# Patient Record
Sex: Female | Born: 1955 | Race: White | Hispanic: No | Marital: Married | Smoking: Never smoker
Health system: Southern US, Community
[De-identification: ages and names within clinical notes are randomized; demographics above are authoritative.]

## PROBLEM LIST (undated history)

## (undated) DIAGNOSIS — C801 Malignant (primary) neoplasm, unspecified: Secondary | ICD-10-CM

## (undated) DIAGNOSIS — E7800 Pure hypercholesterolemia, unspecified: Secondary | ICD-10-CM

## (undated) DIAGNOSIS — I2510 Atherosclerotic heart disease of native coronary artery without angina pectoris: Secondary | ICD-10-CM

## (undated) DIAGNOSIS — T4145XA Adverse effect of unspecified anesthetic, initial encounter: Secondary | ICD-10-CM

## (undated) DIAGNOSIS — Z87442 Personal history of urinary calculi: Secondary | ICD-10-CM

## (undated) DIAGNOSIS — T8859XA Other complications of anesthesia, initial encounter: Secondary | ICD-10-CM

## (undated) DIAGNOSIS — I10 Essential (primary) hypertension: Secondary | ICD-10-CM

## (undated) HISTORY — PX: ANKLE SURGERY: SHX546

## (undated) HISTORY — DX: Personal history of urinary calculi: Z87.442

## (undated) HISTORY — DX: Atherosclerotic heart disease of native coronary artery without angina pectoris: I25.10

---

## 2006-06-20 ENCOUNTER — Ambulatory Visit: Payer: Self-pay | Admitting: Obstetrics and Gynecology

## 2006-07-22 ENCOUNTER — Emergency Department: Payer: Self-pay | Admitting: Emergency Medicine

## 2006-07-22 ENCOUNTER — Other Ambulatory Visit: Payer: Self-pay

## 2006-11-27 ENCOUNTER — Ambulatory Visit: Payer: Self-pay | Admitting: Podiatry

## 2007-07-17 ENCOUNTER — Ambulatory Visit: Payer: Self-pay | Admitting: Obstetrics and Gynecology

## 2007-08-19 ENCOUNTER — Ambulatory Visit: Payer: Self-pay | Admitting: Gastroenterology

## 2008-07-20 ENCOUNTER — Ambulatory Visit: Payer: Self-pay | Admitting: Obstetrics and Gynecology

## 2008-07-23 ENCOUNTER — Ambulatory Visit: Payer: Self-pay | Admitting: Obstetrics and Gynecology

## 2009-06-09 ENCOUNTER — Ambulatory Visit: Payer: Self-pay | Admitting: Obstetrics and Gynecology

## 2009-06-21 ENCOUNTER — Ambulatory Visit: Payer: Self-pay | Admitting: Obstetrics and Gynecology

## 2010-07-11 ENCOUNTER — Other Ambulatory Visit: Payer: Self-pay | Admitting: Family

## 2010-08-01 ENCOUNTER — Other Ambulatory Visit: Payer: Self-pay | Admitting: Family

## 2010-08-02 ENCOUNTER — Ambulatory Visit: Payer: Self-pay | Admitting: Obstetrics and Gynecology

## 2011-08-15 ENCOUNTER — Ambulatory Visit: Payer: Self-pay

## 2011-08-29 ENCOUNTER — Ambulatory Visit: Payer: Self-pay

## 2012-08-20 ENCOUNTER — Ambulatory Visit: Payer: Self-pay

## 2013-08-17 ENCOUNTER — Ambulatory Visit: Payer: Self-pay | Admitting: Orthopedic Surgery

## 2013-11-16 ENCOUNTER — Ambulatory Visit: Payer: Self-pay | Admitting: Podiatry

## 2014-01-13 ENCOUNTER — Ambulatory Visit: Payer: Self-pay

## 2014-01-22 ENCOUNTER — Ambulatory Visit: Payer: Self-pay

## 2014-03-11 DIAGNOSIS — N200 Calculus of kidney: Secondary | ICD-10-CM | POA: Insufficient documentation

## 2014-03-11 DIAGNOSIS — I10 Essential (primary) hypertension: Secondary | ICD-10-CM | POA: Insufficient documentation

## 2014-06-28 ENCOUNTER — Ambulatory Visit
Admission: RE | Admit: 2014-06-28 | Discharge: 2014-06-28 | Disposition: A | Discharge status: Home / Self Care | Payer: BC Managed Care – PPO | Source: Ambulatory Visit | Attending: Internal Medicine | Admitting: Internal Medicine

## 2014-06-28 ENCOUNTER — Other Ambulatory Visit: Payer: Self-pay | Admitting: Internal Medicine

## 2014-06-28 DIAGNOSIS — R079 Chest pain, unspecified: Secondary | ICD-10-CM

## 2014-07-20 ENCOUNTER — Other Ambulatory Visit: Payer: Self-pay | Admitting: Internal Medicine

## 2014-07-20 DIAGNOSIS — R1084 Generalized abdominal pain: Secondary | ICD-10-CM

## 2014-07-22 ENCOUNTER — Other Ambulatory Visit: Payer: BC Managed Care – PPO

## 2014-07-23 ENCOUNTER — Encounter (INDEPENDENT_AMBULATORY_CARE_PROVIDER_SITE_OTHER): Payer: Self-pay

## 2014-07-23 ENCOUNTER — Ambulatory Visit
Admission: RE | Admit: 2014-07-23 | Discharge: 2014-07-23 | Disposition: A | Discharge status: Home / Self Care | Payer: BC Managed Care – PPO | Source: Ambulatory Visit | Attending: Internal Medicine | Admitting: Internal Medicine

## 2014-07-23 DIAGNOSIS — R1084 Generalized abdominal pain: Secondary | ICD-10-CM

## 2015-03-17 ENCOUNTER — Ambulatory Visit
Admit: 2015-03-17 | Disposition: A | Discharge status: Home / Self Care | Payer: Self-pay | Attending: Obstetrics and Gynecology | Admitting: Obstetrics and Gynecology

## 2015-10-07 ENCOUNTER — Other Ambulatory Visit: Payer: Self-pay | Admitting: Unknown Physician Specialty

## 2015-10-07 DIAGNOSIS — L989 Disorder of the skin and subcutaneous tissue, unspecified: Secondary | ICD-10-CM

## 2015-10-07 DIAGNOSIS — G8929 Other chronic pain: Secondary | ICD-10-CM

## 2015-10-07 DIAGNOSIS — M25562 Pain in left knee: Principal | ICD-10-CM

## 2015-10-18 ENCOUNTER — Ambulatory Visit
Admission: RE | Admit: 2015-10-18 | Discharge: 2015-10-18 | Disposition: A | Discharge status: Home / Self Care | Payer: BLUE CROSS/BLUE SHIELD | Source: Ambulatory Visit | Attending: Unknown Physician Specialty | Admitting: Unknown Physician Specialty

## 2015-10-18 DIAGNOSIS — G8929 Other chronic pain: Secondary | ICD-10-CM | POA: Diagnosis present

## 2015-10-18 DIAGNOSIS — M7122 Synovial cyst of popliteal space [Baker], left knee: Secondary | ICD-10-CM | POA: Insufficient documentation

## 2015-10-18 DIAGNOSIS — M2242 Chondromalacia patellae, left knee: Secondary | ICD-10-CM | POA: Insufficient documentation

## 2015-10-18 DIAGNOSIS — M25562 Pain in left knee: Secondary | ICD-10-CM | POA: Diagnosis present

## 2015-10-19 ENCOUNTER — Ambulatory Visit
Admission: RE | Admit: 2015-10-19 | Discharge: 2015-10-19 | Disposition: A | Discharge status: Home / Self Care | Payer: BLUE CROSS/BLUE SHIELD | Source: Ambulatory Visit | Attending: Unknown Physician Specialty | Admitting: Unknown Physician Specialty

## 2015-10-19 DIAGNOSIS — L989 Disorder of the skin and subcutaneous tissue, unspecified: Secondary | ICD-10-CM | POA: Insufficient documentation

## 2015-10-19 MED ORDER — GADOBENATE DIMEGLUMINE 529 MG/ML IV SOLN
20.0000 mL | Freq: Once | INTRAVENOUS | Status: AC | PRN
  Administered 2015-10-19: 20 mL via INTRAVENOUS

## 2015-10-28 ENCOUNTER — Other Ambulatory Visit: Payer: Self-pay

## 2015-10-31 ENCOUNTER — Ambulatory Visit: Payer: Self-pay

## 2015-11-01 ENCOUNTER — Ambulatory Visit: Admission: RE | Admit: 2015-11-01 | Payer: Self-pay | Source: Ambulatory Visit

## 2015-11-01 ENCOUNTER — Ambulatory Visit (HOSPITAL_COMMUNITY): Payer: Self-pay

## 2015-11-01 ENCOUNTER — Ambulatory Visit: Payer: Self-pay

## 2015-12-29 ENCOUNTER — Telehealth: Payer: BLUE CROSS/BLUE SHIELD | Admitting: Physician Assistant

## 2015-12-29 DIAGNOSIS — J019 Acute sinusitis, unspecified: Secondary | ICD-10-CM

## 2015-12-29 DIAGNOSIS — B9689 Other specified bacterial agents as the cause of diseases classified elsewhere: Secondary | ICD-10-CM

## 2015-12-29 MED ORDER — DOXYCYCLINE HYCLATE 100 MG PO CAPS: 100.0000 mg | ORAL_CAPSULE | Freq: Two times a day (BID) | ORAL | Status: DC

## 2015-12-29 NOTE — Progress Notes (Signed)

## 2016-11-06 ENCOUNTER — Encounter: Payer: Self-pay | Admitting: *Deleted

## 2016-11-06 ENCOUNTER — Emergency Department (HOSPITAL_COMMUNITY): Payer: BLUE CROSS/BLUE SHIELD

## 2016-11-06 ENCOUNTER — Ambulatory Visit
Admission: EM | Admit: 2016-11-06 | Discharge: 2016-11-06 | Disposition: A | Discharge status: Home / Self Care | Payer: BLUE CROSS/BLUE SHIELD | Attending: Family Medicine | Admitting: Family Medicine

## 2016-11-06 ENCOUNTER — Encounter (HOSPITAL_COMMUNITY): Payer: Self-pay | Admitting: Emergency Medicine

## 2016-11-06 ENCOUNTER — Emergency Department (HOSPITAL_COMMUNITY)
Admission: EM | Admit: 2016-11-06 | Discharge: 2016-11-06 | Disposition: A | Discharge status: Home / Self Care | Payer: BLUE CROSS/BLUE SHIELD | Attending: Emergency Medicine | Admitting: Emergency Medicine

## 2016-11-06 DIAGNOSIS — R001 Bradycardia, unspecified: Secondary | ICD-10-CM | POA: Diagnosis not present

## 2016-11-06 DIAGNOSIS — R0789 Other chest pain: Secondary | ICD-10-CM | POA: Insufficient documentation

## 2016-11-06 DIAGNOSIS — R9431 Abnormal electrocardiogram [ECG] [EKG]: Secondary | ICD-10-CM | POA: Diagnosis not present

## 2016-11-06 DIAGNOSIS — R079 Chest pain, unspecified: Secondary | ICD-10-CM | POA: Diagnosis present

## 2016-11-06 DIAGNOSIS — I10 Essential (primary) hypertension: Secondary | ICD-10-CM | POA: Diagnosis not present

## 2016-11-06 DIAGNOSIS — Z79899 Other long term (current) drug therapy: Secondary | ICD-10-CM | POA: Diagnosis not present

## 2016-11-06 HISTORY — DX: Pure hypercholesterolemia, unspecified: E78.00

## 2016-11-06 HISTORY — DX: Essential (primary) hypertension: I10

## 2016-11-06 LAB — CBC
HCT: 37.6 % (ref 36.0–46.0)
HEMOGLOBIN: 12.2 g/dL (ref 12.0–15.0)
MCH: 27.9 pg (ref 26.0–34.0)
MCHC: 32.4 g/dL (ref 30.0–36.0)
MCV: 85.8 fL (ref 78.0–100.0)
Platelets: 265 10*3/uL (ref 150–400)
RBC: 4.38 MIL/uL (ref 3.87–5.11)
RDW: 13.9 % (ref 11.5–15.5)
WBC: 4.2 10*3/uL (ref 4.0–10.5)

## 2016-11-06 LAB — I-STAT TROPONIN, ED: TROPONIN I, POC: 0 ng/mL (ref 0.00–0.08)

## 2016-11-06 LAB — BASIC METABOLIC PANEL
ANION GAP: 8 (ref 5–15)
BUN: 13 mg/dL (ref 6–20)
CALCIUM: 9.9 mg/dL (ref 8.9–10.3)
CO2: 25 mmol/L (ref 22–32)
CREATININE: 0.95 mg/dL (ref 0.44–1.00)
Chloride: 105 mmol/L (ref 101–111)
GFR calc Af Amer: >60 mL/min (ref >60)
GLUCOSE: 190 mg/dL — AB (ref 65–99)
Potassium: 3.6 mmol/L (ref 3.5–5.1)
Sodium: 138 mmol/L (ref 135–145)

## 2016-11-06 MED ORDER — ASPIRIN 81 MG PO CHEW
324.0000 mg | CHEWABLE_TABLET | Freq: Once | ORAL | Status: AC
  Administered 2016-11-06: 324 mg via ORAL

## 2016-11-06 NOTE — Discharge Instructions (Signed)
Recommend further evaluation in the ED

## 2016-11-06 NOTE — ED Triage Notes (Signed)
Pt to ED with c/o chest "discomfort" and hypertension. States BP normally runs 120/ 70 but has been up in the past couple days.  Pt works at [employer] in [place]-- was seen at the urgent care and sent here.

## 2016-11-06 NOTE — ED Provider Notes (Signed)
[facility] DEPT Provider Note   CSN: [id number] Arrival date & time: 11/06/16  1447     History   Chief Complaint Chief Complaint  Patient presents with  . Hypertension  . Chest Pain    HPI Roberta Collins is a 60 y.o. female.  The history is provided by the patient.  Chest Pain   This is a new problem. The current episode started 6 to 12 hours ago. The problem occurs constantly. Progression since onset: waxing and waning. The pain is associated with rest. The pain is present in the lateral region. The pain is mild. The quality of the pain is described as sharp. The pain does not radiate. Pertinent negatives include no abdominal pain, no back pain, no diaphoresis, no exertional chest pressure, no irregular heartbeat, no shortness of breath and no vomiting. She has tried nothing for the symptoms.  Her past medical history is significant for hyperlipidemia and hypertension.    Past Medical History:  Diagnosis Date  . Hypercholesteremia   . Hypertension     There are no active problems to display for this patient.   Past Surgical History:  Procedure Laterality Date  . ANKLE SURGERY      OB History    No data available       Home Medications    Prior to Admission medications   Medication Sig Start Date End Date Taking? Authorizing Provider  Cholecalciferol (VITAMIN D PO) Take 1 tablet by mouth daily.   Yes Historical Provider, MD  Coenzyme Q10 (CO Q-10) 100 MG CAPS Take 1 tablet by mouth daily.   Yes Historical Provider, MD  MAGNESIUM PO Take 1 tablet by mouth daily.   Yes Historical Provider, MD  Misc Natural Products (OSTEO BI-FLEX JOINT SHIELD) TABS Take 1 tablet by mouth daily.   Yes Historical Provider, MD  Multiple Vitamin (MULTIVITAMIN WITH MINERALS) TABS tablet Take 1 tablet by mouth daily.   Yes Historical Provider, MD  Multiple Vitamins-Minerals (ZINC PO) Take 1 tablet by mouth daily.   Yes Historical Provider, MD  Probiotic Product (PROBIOTIC PO)  Take 1 tablet by mouth daily.   Yes Historical Provider, MD  rosuvastatin (CRESTOR) 10 MG tablet Take 10 mg by mouth daily.   Yes Historical Provider, MD  telmisartan (MICARDIS) 20 MG tablet Take 20 mg by mouth daily.   Yes Historical Provider, MD  doxycycline (VIBRAMYCIN) 100 MG capsule Take 1 capsule (100 mg total) by mouth 2 (two) times daily. Patient not taking: Reported on 11/06/2016 12/29/15   Brunetta Jeans, PA-C    Family History Family History  Problem Relation Age of Onset  . Hyperlipidemia Mother   . Hypertension Father     Social History Social History  Substance Use Topics  . Smoking status: Never Smoker  . Smokeless tobacco: Never Used  . Alcohol use No     Allergies   Keflex [cephalexin] and Penicillins   Review of Systems Review of Systems  Constitutional: Negative for diaphoresis.  Respiratory: Negative for shortness of breath.   Cardiovascular: Positive for chest pain.  Gastrointestinal: Negative for abdominal pain and vomiting.  Musculoskeletal: Negative for back pain.  All other systems reviewed and are negative.    Physical Exam Updated Vital Signs BP 130/75   Pulse 61   Temp 98.7 F (37.1 C) (Oral)   Resp 13   Ht 5\' 7"  (1.702 m)   Wt 175 lb (79.4 kg)   SpO2 99%   BMI 27.41 kg/m  Physical Exam  Constitutional: She is oriented to person, place, and time. She appears well-developed and well-nourished. No distress.  HENT:  Head: Normocephalic.  Nose: Nose normal.  Eyes: Conjunctivae are normal.  Neck: Neck supple. No tracheal deviation present.  Cardiovascular: Normal rate, regular rhythm and normal heart sounds.  Exam reveals no gallop and no friction rub.   No murmur heard. Pulmonary/Chest: Effort normal and breath sounds normal. No respiratory distress. She exhibits no tenderness.  Abdominal: Soft. She exhibits no distension. There is no tenderness.  Neurological: She is alert and oriented to person, place, and time.  Skin: Skin is  warm and dry.  Psychiatric: She has a normal mood and affect.     ED Treatments / Results  Labs (all labs ordered are listed, but only abnormal results are displayed) Labs Reviewed  BASIC METABOLIC PANEL - Abnormal; Notable for the following:       Result Value   Glucose, Bld 190 (*)    All other components within normal limits  CBC  I-STAT TROPOININ, ED    EKG  EKG Interpretation  Date/Time:  Tuesday November 06 2016 14:56:53 EST Ventricular Rate:  64 PR Interval:  170 QRS Duration: 78 QT Interval:  394 QTC Calculation: 406 R Axis:   72 Text Interpretation:  Normal sinus rhythm Normal ECG Confirmed by Chrishawn Kring MD, Eilan Mcinerny [phone]) on 11/06/2016 5:18:17 PM       Radiology Dg Chest 2 View  Result Date: 11/06/2016 CLINICAL DATA:  Left upper quadrant pain since last night. Hypertension. History of AA rib injury in the past in a similar location. EXAM: CHEST  2 VIEW COMPARISON:  PA and lateral chest x-ray of June 28, 2014 FINDINGS: The lungs are adequately inflated and clear. The heart and pulmonary vascularity are normal. The mediastinum is normal in width. There is no pleural effusion or pneumothorax. The observed bony thorax exhibits no acute abnormality. The gas pattern in the upper abdomen is unremarkable. IMPRESSION: There is no active cardiopulmonary disease. Electronically Signed   By: David  Martinique M.D.   On: 11/06/2016 15:41    Procedures Procedures (including critical care time)  Medications Ordered in ED Medications - No data to display   Initial Impression / Assessment and Plan / ED Course  I have reviewed the triage vital signs and the nursing notes.  Pertinent labs & imaging results that were available during my care of the patient were reviewed by me and considered in my medical decision making (see chart for details).  Clinical Course     60 year old feel presents with atypical left chest pain that started as a "twinge" this morning. It seemed to  persist during the day and she decided to check her blood pressure which was elevated moderately. She is normotensive here. She had EKG at [facility] which showed possible TWI in V1-V3. This is not present on repeat study with unchanged symptoms and may be due to lead placement. Her heart score is 3 with negative troponin despite >6 hours of persistent symptoms taking into consideration these T was abnormalities which are no longer present. I feel that she is appropriate for outpatient follow up given the atypical nature of her symptoms. Discussed routine monitoring of BP and medication changes as an outpatient. Plan to follow up with PCP as needed and return precautions discussed for worsening or new concerning symptoms.   Final Clinical Impressions(s) / ED Diagnoses   Final diagnoses:  Nonspecific chest pain  Essential hypertension    New  Prescriptions New Prescriptions   No medications on file     Leo Grosser, MD 11/07/16 [phone]

## 2016-11-06 NOTE — ED Provider Notes (Signed)
[facility]    CSN: [id number] Arrival date & time: 11/06/16  1447     History   Chief Complaint Chief Complaint  Patient presents with  . Hypertension  . Chest Pain    HPI Roberta Collins is a 60 y.o. female.   60 yo female with a h/o hypertension and hypercholesterolemia presents with a c/o left sided dull chest achy discomfort since this morning. States pain/ache is about 2-3/10. Denies any injuries, shortness of breath, fevers, chills, cough, neck/jaw pain, or arm pain. States has felt clammy this morning since she's been at work.    The history is provided by the patient.  Hypertension  Associated symptoms include chest pain.  Chest Pain    Past Medical History:  Diagnosis Date  . Hypercholesteremia   . Hypertension     There are no active problems to display for this patient.   Past Surgical History:  Procedure Laterality Date  . ANKLE SURGERY      OB History    No data available       Home Medications    Prior to Admission medications   Medication Sig Start Date End Date Taking? Authorizing Provider  Cholecalciferol (VITAMIN D PO) Take 1 tablet by mouth daily.   Yes Historical Provider, MD  Coenzyme Q10 (CO Q-10) 100 MG CAPS Take 1 tablet by mouth daily.   Yes Historical Provider, MD  MAGNESIUM PO Take 1 tablet by mouth daily.   Yes Historical Provider, MD  Misc Natural Products (OSTEO BI-FLEX JOINT SHIELD) TABS Take 1 tablet by mouth daily.   Yes Historical Provider, MD  Multiple Vitamin (MULTIVITAMIN WITH MINERALS) TABS tablet Take 1 tablet by mouth daily.   Yes Historical Provider, MD  Multiple Vitamins-Minerals (ZINC PO) Take 1 tablet by mouth daily.   Yes Historical Provider, MD  Probiotic Product (PROBIOTIC PO) Take 1 tablet by mouth daily.   Yes Historical Provider, MD  rosuvastatin (CRESTOR) 10 MG tablet Take 10 mg by mouth daily.   Yes Historical Provider, MD  telmisartan (MICARDIS) 20 MG tablet Take 20 mg by mouth daily.   Yes  Historical Provider, MD  doxycycline (VIBRAMYCIN) 100 MG capsule Take 1 capsule (100 mg total) by mouth 2 (two) times daily. Patient not taking: Reported on 11/06/2016 12/29/15   Brunetta Jeans, PA-C    Family History Family History  Problem Relation Age of Onset  . Hyperlipidemia Mother   . Hypertension Father     Social History Social History  Substance Use Topics  . Smoking status: Never Smoker  . Smokeless tobacco: Never Used  . Alcohol use No     Allergies   Keflex [cephalexin] and Penicillins   Review of Systems Review of Systems  Cardiovascular: Positive for chest pain.     Physical Exam Triage Vital Signs ED Triage Vitals  Enc Vitals Group     BP 11/06/16 1459 165/80     Pulse Rate 11/06/16 1459 63     Resp 11/06/16 1459 20     Temp 11/06/16 1459 98.7 F (37.1 C)     Temp Source 11/06/16 1459 Oral     SpO2 11/06/16 1459 100 %     Weight 11/06/16 1459 175 lb (79.4 kg)     Height 11/06/16 1459 5\' 7"  (1.702 m)     Head Circumference --      Peak Flow --      Pain Score 11/06/16 1500 3     Pain Loc --  Pain Edu? --      Excl. in [place]? --    No data found.   Updated Vital Signs BP 145/77   Pulse (!) 59   Temp 98.7 F (37.1 C) (Oral)   Resp 12   Ht 5\' 7"  (1.702 m)   Wt 175 lb (79.4 kg)   SpO2 99%   BMI 27.41 kg/m   Visual Acuity Right Eye Distance:   Left Eye Distance:   Bilateral Distance:    Right Eye Near:   Left Eye Near:    Bilateral Near:     Physical Exam  Constitutional: She appears well-developed and well-nourished. No distress.  HENT:  Head: Normocephalic and atraumatic.  Nose: No mucosal edema, rhinorrhea, nose lacerations, sinus tenderness, nasal deformity, septal deviation or nasal septal hematoma. No epistaxis.  No foreign bodies. Right sinus exhibits no maxillary sinus tenderness and no frontal sinus tenderness. Left sinus exhibits no maxillary sinus tenderness and no frontal sinus tenderness.  Neck: Neck supple. No  JVD present. No tracheal deviation present. No thyromegaly present.  Cardiovascular: Regular rhythm and normal heart sounds.  Bradycardia present.   Pulmonary/Chest: Effort normal and breath sounds normal. No stridor. No respiratory distress. She has no wheezes. She has no rales. She exhibits no tenderness.  Skin: She is not diaphoretic.  Nursing note and vitals reviewed.    UC Treatments / Results  Labs (all labs ordered are listed, but only abnormal results are displayed) Labs Reviewed  BASIC METABOLIC PANEL - Abnormal; Notable for the following:       Result Value   Glucose, Bld 190 (*)    All other components within normal limits  CBC  I-STAT TROPOININ, ED    EKG  EKG Interpretation  Date/Time:  Tuesday November 06 2016 14:56:53 EST Ventricular Rate:  64 PR Interval:  170 QRS Duration: 78 QT Interval:  394 QTC Calculation: 406 R Axis:   72 Text Interpretation:  Normal sinus rhythm Normal ECG Confirmed by KNOTT MD, DANIEL [phone]) on 11/06/2016 5:18:17 PM       Radiology Dg Chest 2 View  Result Date: 11/06/2016 CLINICAL DATA:  Left upper quadrant pain since last night. Hypertension. History of AA rib injury in the past in a similar location. EXAM: CHEST  2 VIEW COMPARISON:  PA and lateral chest x-ray of June 28, 2014 FINDINGS: The lungs are adequately inflated and clear. The heart and pulmonary vascularity are normal. The mediastinum is normal in width. There is no pleural effusion or pneumothorax. The observed bony thorax exhibits no acute abnormality. The gas pattern in the upper abdomen is unremarkable. IMPRESSION: There is no active cardiopulmonary disease. Electronically Signed   By: David  Martinique M.D.   On: 11/06/2016 15:41    Procedures ED EKG Date/Time: 11/06/2016 8:45 PM Performed by: Norval Gable Authorized by: Norval Gable   ECG reviewed by ED Physician in the absence of a cardiologist: yes   Previous ECG:    Previous ECG:  Compared to current    Similarity:  Changes noted Interpretation:    Interpretation: abnormal   Rate:    ECG rate:  53   ECG rate assessment: bradycardic   Rhythm:    Rhythm: sinus bradycardia   Ectopy:    Ectopy: none   QRS:    QRS axis:  Normal Conduction:    Conduction: normal   ST segments:    ST segments:  Normal T waves:    T waves: inverted     Inverted:  V1, V2 and V3   (including critical care time)  Medications Ordered in UC Medications - No data to display   Initial Impression / Assessment and Plan / UC Course  I have reviewed the triage vital signs and the nursing notes.  Pertinent labs & imaging results that were available during my care of the patient were reviewed by me and considered in my medical decision making (see chart for details).  Clinical Course       Final Clinical Impressions(s) / UC Diagnoses   Final diagnoses:  Nonspecific chest pain  Essential hypertension    New Prescriptions Discharge Medication List as of 11/06/2016  6:49 PM    1. ekg results and diagnosis reviewed with patient; recommend further evaluation and management at ED. Patient transported by private vehicle (husband driving) to [facility] ED. Report called to triage RN at [facility] ED.    Norval Gable, MD 11/06/16 2046

## 2016-11-06 NOTE — ED Notes (Signed)
Pt given 324mg  ASA at 13:30 at Urgent care

## 2016-11-06 NOTE — ED Triage Notes (Addendum)
Patient started having symptoms of LUQ pain last PM. Patient does have a history of rib injury in the same location. BP is elevated. No other symptoms reported.

## 2016-12-03 ENCOUNTER — Other Ambulatory Visit: Payer: Self-pay | Admitting: Internal Medicine

## 2016-12-03 DIAGNOSIS — Z1231 Encounter for screening mammogram for malignant neoplasm of breast: Secondary | ICD-10-CM

## 2016-12-12 ENCOUNTER — Ambulatory Visit
Admission: RE | Admit: 2016-12-12 | Discharge: 2016-12-12 | Disposition: A | Discharge status: Home / Self Care | Payer: BLUE CROSS/BLUE SHIELD | Source: Ambulatory Visit | Attending: Internal Medicine | Admitting: Internal Medicine

## 2016-12-12 DIAGNOSIS — Z1231 Encounter for screening mammogram for malignant neoplasm of breast: Secondary | ICD-10-CM | POA: Insufficient documentation

## 2016-12-12 HISTORY — DX: Malignant (primary) neoplasm, unspecified: C80.1

## 2017-01-31 NOTE — ED Provider Notes (Signed)
[facility]    CSN: [id number] Arrival date & time: 11/06/16  1122     History   Chief Complaint Chief Complaint  Patient presents with  . Abdominal Pain    HPI Roberta Collins is a 61 y.o. female.   61 yo female with a c/o left lower chest discomfort intermittently since last night. Denies any fevers, chills, shortness of breath, palpitations, syncope. States currently feeling some discomfort. Patient has a history of hypertension and hypercholesterolemia.    The history is provided by the patient.  Abdominal Pain    Past Medical History:  Diagnosis Date  . Cancer ([place])    skin ca  . Hypercholesteremia   . Hypertension     There are no active problems to display for this patient.   Past Surgical History:  Procedure Laterality Date  . ANKLE SURGERY      OB History    No data available       Home Medications    Prior to Admission medications   Medication Sig Start Date End Date Taking? Authorizing Provider  rosuvastatin (CRESTOR) 10 MG tablet Take 10 mg by mouth daily.   Yes Historical Provider, MD  telmisartan (MICARDIS) 20 MG tablet Take 20 mg by mouth daily.   Yes Historical Provider, MD  Cholecalciferol (VITAMIN D PO) Take 1 tablet by mouth daily.    Historical Provider, MD  Coenzyme Q10 (CO Q-10) 100 MG CAPS Take 1 tablet by mouth daily.    Historical Provider, MD  doxycycline (VIBRAMYCIN) 100 MG capsule Take 1 capsule (100 mg total) by mouth 2 (two) times daily. Patient not taking: Reported on 11/06/2016 12/29/15   Brunetta Jeans, PA-C  MAGNESIUM PO Take 1 tablet by mouth daily.    Historical Provider, MD  Misc Natural Products (OSTEO BI-FLEX JOINT SHIELD) TABS Take 1 tablet by mouth daily.    Historical Provider, MD  Multiple Vitamin (MULTIVITAMIN WITH MINERALS) TABS tablet Take 1 tablet by mouth daily.    Historical Provider, MD  Multiple Vitamins-Minerals (ZINC PO) Take 1 tablet by mouth daily.    Historical Provider, MD  Probiotic  Product (PROBIOTIC PO) Take 1 tablet by mouth daily.    Historical Provider, MD    Family History Family History  Problem Relation Age of Onset  . Hyperlipidemia Mother   . Breast cancer Mother 92    twice also age 70  . Hypertension Father   . Breast cancer Sister 50    Social History Social History  Substance Use Topics  . Smoking status: Never Smoker  . Smokeless tobacco: Never Used  . Alcohol use No     Allergies   Keflex [cephalexin] and Penicillins   Review of Systems Review of Systems  Gastrointestinal: Positive for abdominal pain.     Physical Exam Triage Vital Signs ED Triage Vitals  Enc Vitals Group     BP 11/06/16 1133 (!) 156/79     Pulse Rate 11/06/16 1133 70     Resp 11/06/16 1133 16     Temp 11/06/16 1133 98.8 F (37.1 C)     Temp Source 11/06/16 1133 Oral     SpO2 11/06/16 1133 100 %     Weight 11/06/16 1134 175 lb (79.4 kg)     Height 11/06/16 1134 5\' 7"  (1.702 m)     Head Circumference --      Peak Flow --      Pain Score 11/06/16 1138 2  Pain Loc --      Pain Edu? --      Excl. in [place]? --    No data found.   Updated Vital Signs BP (!) 156/79 (BP Location: Left Arm)   Pulse 70   Temp 98.8 F (37.1 C) (Oral)   Resp 16   Ht 5\' 7"  (1.702 m)   Wt 175 lb (79.4 kg)   SpO2 100%   BMI 27.41 kg/m   Visual Acuity Right Eye Distance:   Left Eye Distance:   Bilateral Distance:    Right Eye Near:   Left Eye Near:    Bilateral Near:     Physical Exam  Constitutional: She appears well-developed and well-nourished. No distress.  HENT:  Head: Normocephalic and atraumatic.  Right Ear: Tympanic membrane and ear canal normal.  Left Ear: Tympanic membrane and ear canal normal.  Nose: No mucosal edema, rhinorrhea, nose lacerations, sinus tenderness, nasal deformity, septal deviation or nasal septal hematoma. No epistaxis.  No foreign bodies. Right sinus exhibits no maxillary sinus tenderness and no frontal sinus tenderness. Left sinus  exhibits no maxillary sinus tenderness and no frontal sinus tenderness.  Mouth/Throat: Uvula is midline, oropharynx is clear and moist and mucous membranes are normal. No oropharyngeal exudate.  Neck: Normal range of motion. Neck supple. No thyromegaly present.  Cardiovascular: Normal rate, regular rhythm and normal heart sounds.   Pulmonary/Chest: Effort normal and breath sounds normal. No respiratory distress. She has no wheezes. She has no rales.  Abdominal: Soft. Bowel sounds are normal. She exhibits no distension and no mass. There is no tenderness. There is no rebound and no guarding.  Musculoskeletal: She exhibits no edema.  Lymphadenopathy:    She has no cervical adenopathy.  Skin: She is not diaphoretic.  Nursing note and vitals reviewed.    UC Treatments / Results  Labs (all labs ordered are listed, but only abnormal results are displayed) Labs Reviewed - No data to display  EKG  EKG Interpretation None       Radiology No results found.  Procedures Procedures (including critical care time)  Medications Ordered in UC Medications  aspirin chewable tablet 324 mg (324 mg Oral Given 11/06/16 1326)     Initial Impression / Assessment and Plan / UC Course  I have reviewed the triage vital signs and the nursing notes.  Pertinent labs & imaging results that were available during my care of the patient were reviewed by me and considered in my medical decision making (see chart for details).       Final Clinical Impressions(s) / UC Diagnoses   Final diagnoses:  Chest pain, unspecified type    New Prescriptions Discharge Medication List as of 11/06/2016  1:56 PM     1. ekg results and possible etiologies reviewed with patient; due to patient's cardiac risk factors, recommend patient go to ED for further evaluation/management. Patient verbalizes understanding and will proceed to [facility] ED by private vehicle (with her husband driving).    Norval Gable, MD 01/31/17 1225

## 2018-02-20 ENCOUNTER — Other Ambulatory Visit: Payer: Self-pay | Admitting: Internal Medicine

## 2018-02-20 DIAGNOSIS — Z1231 Encounter for screening mammogram for malignant neoplasm of breast: Secondary | ICD-10-CM

## 2018-02-25 ENCOUNTER — Ambulatory Visit
Admission: RE | Admit: 2018-02-25 | Discharge: 2018-02-25 | Disposition: A | Discharge status: Home / Self Care | Payer: BLUE CROSS/BLUE SHIELD | Source: Ambulatory Visit | Attending: Internal Medicine | Admitting: Internal Medicine

## 2018-02-25 DIAGNOSIS — Z1231 Encounter for screening mammogram for malignant neoplasm of breast: Secondary | ICD-10-CM | POA: Diagnosis not present

## 2018-05-05 ENCOUNTER — Telehealth: Payer: Self-pay | Admitting: Gastroenterology

## 2018-05-05 ENCOUNTER — Other Ambulatory Visit: Payer: Self-pay

## 2018-05-05 DIAGNOSIS — Z1211 Encounter for screening for malignant neoplasm of colon: Secondary | ICD-10-CM

## 2018-05-05 NOTE — Telephone Encounter (Signed)
When you get Matalie's directions in her chart let me know. You don't need to mail it. 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 I also gave her a bowel prep. Thanks.

## 2018-05-07 ENCOUNTER — Other Ambulatory Visit: Payer: Self-pay

## 2018-05-07 NOTE — Telephone Encounter (Signed)
Roberta Collins sent instructions via my chart.  You can go under the letters tab to print if she prefers to have paper copy.  Thanks [employer]

## 2018-05-08 ENCOUNTER — Other Ambulatory Visit: Payer: Self-pay

## 2018-06-26 ENCOUNTER — Encounter: Payer: Self-pay | Admitting: Gastroenterology

## 2018-06-26 ENCOUNTER — Other Ambulatory Visit: Payer: Self-pay

## 2018-06-26 ENCOUNTER — Ambulatory Visit (INDEPENDENT_AMBULATORY_CARE_PROVIDER_SITE_OTHER): Payer: BLUE CROSS/BLUE SHIELD | Admitting: Gastroenterology

## 2018-06-26 VITALS — BP 127/75 | HR 67 | Resp 17 | Wt 178.8 lb

## 2018-06-26 DIAGNOSIS — R1310 Dysphagia, unspecified: Secondary | ICD-10-CM

## 2018-06-26 NOTE — Progress Notes (Signed)
Jonathon Bellows MD, MRCP(U.K) [address]  [place]  [place], [place] [address]  Main: [phone]  Fax: [phone]   Gastroenterology Consultation  Referring Provider:     No ref. provider found Primary Care Physician:  Willey Blade, MD Primary Gastroenterologist:  Dr. Jonathon Bellows  Reason for Consultation:     Dysphagia         HPI:   Roberta Collins is a 62 y.o. y/o female referred for consultation & management  by Dr. Willey Blade, MD.    She has been referred for dysphagia.   She has been referred for difficulty with swallowing   Dysphagia: Onset and any progression: every now and then when she swallows feels something stuck, does not restrict her swallowing , last 3-4 months, did Prilosec for 30 days, saw ENT. Caused urinary incontinence. Was taking before meals Frequency: occasionally  Foods affected : more for soft foods. , not an issue for steak , more for vegetables , almost feels she can touch it.denies any post nasal drip .  Prior episodes of impaction: no  History of asthma/allergy : no  History of heartburn/Reflux : no  Weight loss/weight gain : no   Prior EGD: none  PPI/H2 blocker use : yes Non smoker.  She is scheduled for a colonoscopy on 07/04/18.    Past Medical History:  Diagnosis Date  . Cancer ([place])    skin ca  . Hypercholesteremia   . Hypertension     Past Surgical History:  Procedure Laterality Date  . ANKLE SURGERY      Prior to Admission medications   Medication Sig Start Date End Date Taking? Authorizing Provider  aspirin (ASPIR-LOW) 81 MG EC tablet Aspir-Low 81 mg tablet,delayed release  Take 1 tablet every day by oral route.   Yes [provider]  Cholecalciferol (VITAMIN D PO) Take 1 tablet by mouth daily.   Yes [provider]  Coenzyme Q10 (CO Q-10) 100 MG CAPS Take 1 tablet by mouth daily.   Yes [provider]  fenofibrate 160 MG tablet TAKE 1 TABLET EVERY DAY BY ORAL ROUTE. 04/20/18  Yes  [provider]  fexofenadine (ALLEGRA) 180 MG tablet Take by mouth.   Yes [provider]  MAGNESIUM PO Take 1 tablet by mouth daily.   Yes [provider]  Misc Natural Products (OSTEO BI-FLEX JOINT SHIELD) TABS Take 1 tablet by mouth daily.   Yes [provider]  Multiple Vitamin (MULTI-VITAMINS) TABS Take by mouth.   Yes [provider]  Multiple Vitamins-Minerals (ZINC PO) Take 1 tablet by mouth daily.   Yes [provider]  PAZEO 0.7 % SOLN  05/13/18  Yes [provider]  Probiotic Product (PROBIOTIC PO) Take 1 tablet by mouth daily.   Yes [provider]  telmisartan (MICARDIS) 20 MG tablet Take 20 mg by mouth daily.   Yes [provider]  celecoxib (CELEBREX) 200 MG capsule Take by mouth.    [provider]  Diclofenac Sodium 3 % GEL diclofenac 3 % topical gel    [provider]  doxycycline (VIBRAMYCIN) 100 MG capsule Take 1 capsule (100 mg total) by mouth 2 (two) times daily. Patient not taking: Reported on 11/06/2016 12/29/15   Brunetta Jeans, PA-C  Fluocinonide 0.1 % CREA fluocinonide 0.1 % topical cream    [provider]  hydrOXYzine (ATARAX/VISTARIL) 25 MG tablet  01/25/14   [provider]  lidocaine (XYLOCAINE) 5 % ointment lidocaine 5 %  topical ointment    [provider]  metroNIDAZOLE (FLAGYL) 500 MG tablet metronidazole 500 mg tablet    [provider]  Multiple Vitamin (MULTIVITAMIN WITH MINERALS) TABS tablet Take 1 tablet by mouth daily.    [provider]  omeprazole (PRILOSEC) 40 MG capsule omeprazole 40 mg capsule,delayed release    [provider]  rosuvastatin (CRESTOR) 10 MG tablet Take 10 mg by mouth daily.    [provider]  telmisartan (MICARDIS) 40 MG tablet telmisartan 40 mg tablet 08/09/15   [provider]    Family History  Problem Relation Age of Onset  . Hyperlipidemia Mother   . Breast  cancer Mother 78       twice also age 82  . Hypertension Father   . Breast cancer Sister 63     Social History   Tobacco Use  . Smoking status: Never Smoker  . Smokeless tobacco: Never Used  Substance Use Topics  . Alcohol use: No  . Drug use: No    Allergies as of 06/26/2018 - Review Complete 06/26/2018  Allergen Reaction Noted  . Cephalexin Hives and Nausea Only 03/11/2014  . Penicillins Hives and Itching 03/11/2014  . Cefdinir Hives 03/11/2014  . Oxycodone-acetaminophen Nausea Only 03/11/2014    Review of Systems:    All systems reviewed and negative except where noted in HPI.   Physical Exam:  BP 127/75 (BP Location: Left Arm, Patient Position: Sitting, Cuff Size: Large)   Pulse 67   Resp 17   Wt 178 lb 12.8 oz (81.1 kg)   BMI 28.00 kg/m  No LMP recorded. Patient is postmenopausal. Psych:  Alert and cooperative. Normal mood and affect. General:   Alert,  Well-developed, well-nourished, pleasant and cooperative in NAD Head:  Normocephalic and atraumatic. Eyes:  Sclera clear, no icterus.   Conjunctiva pink. Ears:  Normal auditory acuity. Nose:  No deformity, discharge, or lesions. Mouth:  No deformity or lesions,oropharynx pink & moist. Neck:  Supple; no masses or thyromegaly. Lungs:  Respirations even and unlabored.  Clear throughout to auscultation.   No wheezes, crackles, or rhonchi. No acute distress. Heart:  Regular rate and rhythm; no murmurs, clicks, rubs, or gallops. Abdomen:  Normal bowel sounds.  No bruits.  Soft, non-tender and non-distended without masses, hepatosplenomegaly or hernias noted.  No guarding or rebound tenderness.    Neurologic:  Alert and oriented x3;  grossly normal neurologically. Skin:  Intact without significant lesions or rashes. No jaundice. Lymph Nodes:  No significant cervical adenopathy. Psych:  Alert and cooperative. Normal mood and affect.  Imaging Studies: No results found.  Assessment and Plan:   Roberta Collins is a 62  y.o. y/o female has been referred for dysphagia. Her history is suggestive more of a globus sensation vs spasm  Plan  1.  EGD+ bx to r/o EOE and strictures- will do procedure the same day as her colonoscopy  2. If negative may need esophageal manometry/Ph    I have discussed alternative options, risks & benefits,  which include, but are not limited to, bleeding, infection, perforation,respiratory complication & drug reaction.  The patient agrees with this plan & written consent will be obtained.     Follow up in PRN  Dr Jonathon Bellows MD,MRCP(U.K)

## 2018-07-03 ENCOUNTER — Encounter

## 2018-07-03 ENCOUNTER — Telehealth: Payer: Self-pay

## 2018-07-03 ENCOUNTER — Encounter: Payer: Self-pay | Admitting: Cardiovascular Disease

## 2018-07-03 ENCOUNTER — Ambulatory Visit (INDEPENDENT_AMBULATORY_CARE_PROVIDER_SITE_OTHER): Payer: BLUE CROSS/BLUE SHIELD | Admitting: Cardiovascular Disease

## 2018-07-03 VITALS — BP 128/81 | HR 65 | Ht 67.0 in | Wt 176.0 lb

## 2018-07-03 DIAGNOSIS — E785 Hyperlipidemia, unspecified: Secondary | ICD-10-CM | POA: Diagnosis not present

## 2018-07-03 DIAGNOSIS — I10 Essential (primary) hypertension: Secondary | ICD-10-CM

## 2018-07-03 MED ORDER — AMLODIPINE BESYLATE 2.5 MG PO TABS: 2.5000 mg | ORAL_TABLET | Freq: Every day | ORAL | 3 refills | Status: DC

## 2018-07-03 NOTE — Patient Instructions (Addendum)
Medication Instructions:  Your physician has recommended you make the following change in your medication:  1- STOP Micardis. 2- START Amlodipine 2.5 mg  (1 tablet) by mouth once a day.   Labwork: none  Testing/Procedures: Your physician has order a CT Calcium Scoring. This procedure uses special x-ray equipment to produce pictures of the coronary arteries to determine if they are blocked or narrowed by the buildup of plaque - an indicator for atherosclerosis or coronary artery disease (CAD). Please call [phone] to schedule. This exam costs $150 out of pocket and is not covered by insurance.    Follow-Up: Your physician recommends that you schedule a follow-up appointment in: as needed.

## 2018-07-03 NOTE — Progress Notes (Signed)
Cardiology Office Note   Date:  07/03/2018   ID:  Roberta Collins, DOB March 01, 1956, MRN [id number]  PCP:  Willey Blade, MD  Cardiologist:   Kathlyn Sacramento, MD   Chief Complaint  Patient presents with  . OTHER    C/o BP issues and Lightheadedness. Meds reviewed verbally with pt.      History of Present Illness: Roberta Collins is a 62 y.o. female who is self-referred for evaluation of hypertension and dizziness.  She has no prior cardiac history and had stress testing more than 10 years ago which was unremarkable.  She reports being diagnosed with hypertension about 10 years ago and initially was treated with Micardis with hydrochlorothiazide but her blood pressure dropped and thus hydrochlorothiazide was discontinued.  The dose of Micardis was decreased gradually but she is currently on the lowest dose of 20 mg daily.  In spite of that, she experiences episodes of low blood pressure with dizziness.  She also complains of orthostatic dizziness.  Her blood pressure occasionally goes down below 992 systolic and that when she gets dizzy.  She had one episode of presyncope but no syncope or palpitations.  No chest pain or shortness of breath.  She is very active and exercises regularly.  She brisk walks at least 45 minutes without limitations.  She is a lifelong non-smoker.  Family history is negative for premature coronary artery disease.  There is family history of hypertension.  She has known history of hyperlipidemia with significantly elevated LDL but her HDL is high.    Past Medical History:  Diagnosis Date  . Cancer ([facility])    skin ca  . History of kidney stones   . Hypercholesteremia   . Hypertension     Past Surgical History:  Procedure Laterality Date  . ANKLE SURGERY       Current Outpatient Medications  Medication Sig Dispense Refill  . aspirin (ASPIR-LOW) 81 MG EC tablet Aspir-Low 81 mg tablet,delayed release  Take 1 tablet every day by oral route.    .  Coenzyme Q10 (CO Q-10) 100 MG CAPS Take 1 tablet by mouth daily.    . fenofibrate 160 MG tablet TAKE 1 TABLET EVERY DAY BY ORAL ROUTE.  3  . fexofenadine (ALLEGRA) 180 MG tablet Take by mouth.    Marland Kitchen MAGNESIUM PO Take 1 tablet by mouth daily.    . Misc Natural Products (OSTEO BI-FLEX JOINT SHIELD) TABS Take 1 tablet by mouth daily.    Marland Kitchen PAZEO 0.7 % SOLN     . Probiotic Product (PROBIOTIC PO) Take 1 tablet by mouth daily.    Marland Kitchen amLODipine (NORVASC) 2.5 MG tablet Take 1 tablet (2.5 mg total) by mouth daily. 90 tablet 3   No current facility-administered medications for this visit.     Allergies:   Cephalexin; Penicillins; Cefdinir; and Oxycodone-acetaminophen    Social History:  The patient  reports that she has never smoked. She has never used smokeless tobacco. She reports that she does not drink alcohol or use drugs.   Family History:  The patient's family history includes Arrhythmia in her mother; Atrial fibrillation in her mother; Breast cancer (age of onset: 70) in her sister; Breast cancer (age of onset: 51) in her mother; Hyperlipidemia in her mother; Hypertension in her father and mother.    ROS:  Please see the history of present illness.   Otherwise, review of systems are positive for none.   All other systems are reviewed and negative.  PHYSICAL EXAM: VS:  BP 128/81 (BP Location: Right Arm, Patient Position: Sitting, Cuff Size: Normal)   Pulse 65   Ht 5\' 7"  (1.702 m)   Wt 176 lb (79.8 kg)   BMI 27.57 kg/m  , BMI Body mass index is 27.57 kg/m. GEN: Well nourished, well developed, in no acute distress  HEENT: normal  Neck: no JVD, carotid bruits, or masses Cardiac: RRR; no murmurs, rubs, or gallops,no edema  Respiratory:  clear to auscultation bilaterally, normal work of breathing GI: soft, nontender, nondistended, + BS MS: no deformity or atrophy  Skin: warm and dry, no rash Neuro:  Strength and sensation are intact Psych: euthymic mood, full affect   EKG:  EKG is  ordered today. The ekg ordered today demonstrates normal sinus rhythm with no significant ST or T wave changes.   Recent Labs: No results found for requested labs within last 8760 hours.    Lipid Panel No results found for: CHOL, TRIG, HDL, CHOLHDL, VLDL, LDLCALC, LDLDIRECT    Wt Readings from Last 3 Encounters:  07/03/18 176 lb (79.8 kg)  06/26/18 178 lb 12.8 oz (81.1 kg)  11/06/16 175 lb (79.4 kg)       PAD Screen 07/03/2018  Previous PAD dx? No  Previous surgical procedure? No  Pain with walking? No  Feet/toe relief with dangling? No  Painful, non-healing ulcers? No  Extremities discolored? No      ASSESSMENT AND PLAN:  1.  Essential hypertension: Blood pressure has been somewhat on the low side with orthostatic dizziness.  Her orthostatic vitals today revealed no drop in blood pressure.  However, heart rate increased 19 points from lying to standing position which is likely contributing to some of her symptoms. I elected to switch Micardis to amlodipine 2.5 mg once daily.  She has no symptoms suggestive of arrhythmia, heart failure or ischemic heart disease.  2.  Hyperlipidemia: LDL in the past has been as high as 150 but her HDL is known to be high.  I think CT calcium score can be helpful in this situation for risk stratification I have recommended this.  We will go ahead and schedule the test.    Disposition:   FU with me as needed.   Signed,  Kathlyn Sacramento, MD  07/03/2018 9:59 AM    [facility]

## 2018-07-03 NOTE — Telephone Encounter (Signed)
Patients colonoscopy/egd has been rescheduled to 08/08/18.  Thanks [employer]

## 2018-07-10 ENCOUNTER — Ambulatory Visit (INDEPENDENT_AMBULATORY_CARE_PROVIDER_SITE_OTHER)
Admission: RE | Admit: 2018-07-10 | Discharge: 2018-07-10 | Disposition: A | Discharge status: Home / Self Care | Payer: Self-pay | Source: Ambulatory Visit | Attending: Cardiovascular Disease | Admitting: Cardiovascular Disease

## 2018-07-10 DIAGNOSIS — E785 Hyperlipidemia, unspecified: Secondary | ICD-10-CM

## 2018-07-11 ENCOUNTER — Telehealth: Payer: Self-pay | Admitting: *Deleted

## 2018-07-11 DIAGNOSIS — I10 Essential (primary) hypertension: Secondary | ICD-10-CM

## 2018-07-11 DIAGNOSIS — E785 Hyperlipidemia, unspecified: Secondary | ICD-10-CM

## 2018-07-11 MED ORDER — ROSUVASTATIN CALCIUM 20 MG PO TABS: 20.0000 mg | ORAL_TABLET | Freq: Every day | ORAL | 3 refills | Status: DC

## 2018-07-11 NOTE — Telephone Encounter (Signed)
Patient made aware of results and verbalized understanding. Orders have been placed and scheduling has been made aware.

## 2018-07-11 NOTE — Telephone Encounter (Signed)
-----   Message from Wellington Hampshire, MD sent at 07/11/2018  3:58 PM EDT ----- Inform patient that CT calcium score was significantly elevated at 446 which indicates the presence of significant coronary atherosclerosis. I recommend scheduling her for a treadmill stress test to ensure no obstructive disease. Continue aspirin 81 mg once daily. Add rosuvastatin 20 mg once daily to get her LDL down.  She can stop fenofibrate once she start rosuvastatin.  Check lipid and liver profile in 6 weeks. Follow-up with me in 2 months.

## 2018-07-24 ENCOUNTER — Telehealth: Payer: Self-pay | Admitting: *Deleted

## 2018-07-24 NOTE — Telephone Encounter (Signed)
Left a message for the patient to call back for instructions about her ETT tomorrow.

## 2018-07-25 ENCOUNTER — Telehealth: Payer: Self-pay | Admitting: Cardiovascular Disease

## 2018-07-25 ENCOUNTER — Ambulatory Visit (INDEPENDENT_AMBULATORY_CARE_PROVIDER_SITE_OTHER): Payer: BLUE CROSS/BLUE SHIELD

## 2018-07-25 DIAGNOSIS — E785 Hyperlipidemia, unspecified: Secondary | ICD-10-CM | POA: Diagnosis not present

## 2018-07-25 DIAGNOSIS — I10 Essential (primary) hypertension: Secondary | ICD-10-CM | POA: Diagnosis not present

## 2018-07-25 NOTE — Telephone Encounter (Signed)
The patient was in the office today for her GXT per Dr. Fletcher Anon due to an elevated calcium score.  She states Dr. Fletcher Anon stopped her Micardis due to orthostatic hypotension about 2 weeks ago and placed her on amlodipine 2.5 mg once daily. She has been having bilateral feet/ ankle swelling on amlodipine.  She states BP's at home have been around 307 systolic.  She sees some slight variations in her BP's but just within a few points.  She states her crestor was also increased to 20 mg from 10 mg about 2 weeks ago.  She is having joint discomfort and low back pain since increasing the dose on her medication.  I advised the patient I would forward to Dr. Fletcher Anon to review her symptoms and we can call her back with further recommendations and the results of her GXT.  The patient is agreeable and voices understanding.

## 2018-08-01 ENCOUNTER — Telehealth: Payer: Self-pay | Admitting: Cardiovascular Disease

## 2018-08-01 LAB — EXERCISE TOLERANCE TEST
CHL CUP MPHR: 157 {beats}/min
Estimated workload: 7 METS
Exercise duration (min): 5 min
Exercise duration (sec): 43 s
Peak HR: 151 {beats}/min
Percent HR: 96 %
RPE: 12
Rest HR: 85 {beats}/min

## 2018-08-01 MED ORDER — TELMISARTAN 20 MG PO TABS: 20.0000 mg | ORAL_TABLET | Freq: Every day | ORAL | 3 refills | Status: DC

## 2018-08-01 MED ORDER — ROSUVASTATIN CALCIUM 10 MG PO TABS: 10.0000 mg | ORAL_TABLET | Freq: Every day | ORAL | 3 refills | Status: DC

## 2018-08-01 NOTE — Telephone Encounter (Signed)
Patient made aware of results and verbalized understanding.   Notes recorded by Wellington Hampshire, MD on 08/01/2018 at 2:27 PM EDT Inform patient that stress test was normal.

## 2018-08-01 NOTE — Telephone Encounter (Signed)
Her stress test was normal but blood pressure increased significantly with exercise. She can decrease Crestor back to 10 mg daily if she is still having joint and back pain. If she is still having leg swelling with amlodipine, we can switch her back to Micardis.

## 2018-08-01 NOTE — Telephone Encounter (Signed)
Patient made aware of results and verbalized her understanding.   Crestor has been decreased to 10 mg daily.  She would like to restart the Micardis 20 mg. Prescription has been sent in and amlodipine has been discontinued.

## 2018-08-01 NOTE — Telephone Encounter (Signed)
Patient made aware that we will call when the results have been made available.

## 2018-08-01 NOTE — Telephone Encounter (Signed)
Pt would like stress test results. Please call and advise.

## 2018-08-04 ENCOUNTER — Telehealth: Payer: Self-pay | Admitting: Cardiovascular Disease

## 2018-08-04 NOTE — Telephone Encounter (Signed)
Please review pharmacy sent request for change in medication due to backorder.

## 2018-08-05 ENCOUNTER — Telehealth: Payer: Self-pay | Admitting: Cardiovascular Disease

## 2018-08-05 NOTE — Telephone Encounter (Signed)
valsartan (DIOVAN) 80 MG tablet       Changed from: telmisartan (MICARDIS) 20 MG tablet   Sig: Please specify directions, refills and quantity   Disp:  1 tablet  Refills:  0   Start: 08/04/2018   Class: Normal   Last ordered: 4 days ago by Wellington Hampshire, MD    Pharmacy comment: Alternative Requested:PLEASE RESEND A NEW RX FOR SOMETHING DIFFERENT. THIS IS ON INDEFINITE BACKORDER.     To be filled at: [id number] - [place], [place] - [address]     Message received to triage from the refill pool. To Dr. Fletcher Anon to address.

## 2018-08-05 NOTE — Telephone Encounter (Signed)
Valsartan 80 mg once daily is fine as an alternative.

## 2018-08-07 MED ORDER — VALSARTAN 80 MG PO TABS: 80.0000 mg | ORAL_TABLET | Freq: Every day | ORAL | 3 refills | Status: DC

## 2018-08-07 NOTE — Telephone Encounter (Signed)
Rx for Valsartan 80 mg daily sent to pharmacy. Ordered by Dr Fletcher Anon on 08/05/18.

## 2018-08-07 NOTE — Telephone Encounter (Signed)
Please advise 

## 2018-08-07 NOTE — Telephone Encounter (Signed)
See 08/04/18 phone note. Addressed in an additional encounter.

## 2018-08-07 NOTE — Telephone Encounter (Signed)
I already approved the alternative and sent a note to [facility] 2 days ago.

## 2018-08-08 ENCOUNTER — Ambulatory Visit: Payer: BLUE CROSS/BLUE SHIELD | Admitting: Anesthesiology

## 2018-08-08 ENCOUNTER — Encounter
Admission: RE | Disposition: A | Discharge status: Home / Self Care | Payer: Self-pay | Source: Ambulatory Visit | Attending: Gastroenterology

## 2018-08-08 ENCOUNTER — Other Ambulatory Visit: Payer: Self-pay | Admitting: Cardiovascular Disease

## 2018-08-08 ENCOUNTER — Encounter: Payer: Self-pay | Admitting: *Deleted

## 2018-08-08 ENCOUNTER — Ambulatory Visit
Admission: RE | Admit: 2018-08-08 | Discharge: 2018-08-08 | Disposition: A | Discharge status: Home / Self Care | Payer: BLUE CROSS/BLUE SHIELD | Source: Ambulatory Visit | Attending: Gastroenterology | Admitting: Gastroenterology

## 2018-08-08 DIAGNOSIS — Z85828 Personal history of other malignant neoplasm of skin: Secondary | ICD-10-CM | POA: Diagnosis not present

## 2018-08-08 DIAGNOSIS — Z7982 Long term (current) use of aspirin: Secondary | ICD-10-CM | POA: Insufficient documentation

## 2018-08-08 DIAGNOSIS — Z803 Family history of malignant neoplasm of breast: Secondary | ICD-10-CM | POA: Insufficient documentation

## 2018-08-08 DIAGNOSIS — Z1211 Encounter for screening for malignant neoplasm of colon: Secondary | ICD-10-CM | POA: Diagnosis not present

## 2018-08-08 DIAGNOSIS — L83 Acanthosis nigricans: Secondary | ICD-10-CM | POA: Insufficient documentation

## 2018-08-08 DIAGNOSIS — I10 Essential (primary) hypertension: Secondary | ICD-10-CM | POA: Diagnosis not present

## 2018-08-08 DIAGNOSIS — Z87442 Personal history of urinary calculi: Secondary | ICD-10-CM | POA: Diagnosis not present

## 2018-08-08 DIAGNOSIS — Z8249 Family history of ischemic heart disease and other diseases of the circulatory system: Secondary | ICD-10-CM | POA: Diagnosis not present

## 2018-08-08 DIAGNOSIS — E7800 Pure hypercholesterolemia, unspecified: Secondary | ICD-10-CM | POA: Insufficient documentation

## 2018-08-08 DIAGNOSIS — R1310 Dysphagia, unspecified: Secondary | ICD-10-CM | POA: Diagnosis not present

## 2018-08-08 DIAGNOSIS — Z79899 Other long term (current) drug therapy: Secondary | ICD-10-CM | POA: Diagnosis not present

## 2018-08-08 HISTORY — DX: Adverse effect of unspecified anesthetic, initial encounter: T41.45XA

## 2018-08-08 HISTORY — PX: COLONOSCOPY WITH PROPOFOL: SHX5780

## 2018-08-08 HISTORY — PX: ESOPHAGOGASTRODUODENOSCOPY (EGD) WITH PROPOFOL: SHX5813

## 2018-08-08 HISTORY — DX: Other complications of anesthesia, initial encounter: T88.59XA

## 2018-08-08 SURGERY — COLONOSCOPY WITH PROPOFOL
Anesthesia: General

## 2018-08-08 MED ORDER — SODIUM CHLORIDE 0.9 % IV SOLN
INTRAVENOUS | Status: DC
  Administered 2018-08-08: 1000 mL via INTRAVENOUS

## 2018-08-08 MED ORDER — PROPOFOL 500 MG/50ML IV EMUL
INTRAVENOUS | Status: DC | PRN
  Administered 2018-08-08: 130 ug/kg/min via INTRAVENOUS

## 2018-08-08 MED ORDER — LIDOCAINE HCL (PF) 2 % IJ SOLN
INTRAMUSCULAR | Status: AC
  Filled 2018-08-08: qty 10

## 2018-08-08 MED ORDER — MIDAZOLAM HCL 2 MG/2ML IJ SOLN
INTRAMUSCULAR | Status: AC
  Filled 2018-08-08: qty 2

## 2018-08-08 MED ORDER — TELMISARTAN 40 MG PO TABS: 20.0000 mg | ORAL_TABLET | Freq: Every day | ORAL | Status: AC

## 2018-08-08 MED ORDER — PROPOFOL 10 MG/ML IV BOLUS
INTRAVENOUS | Status: DC | PRN
  Administered 2018-08-08: 70 mg via INTRAVENOUS

## 2018-08-08 MED ORDER — LIDOCAINE HCL (CARDIAC) PF 100 MG/5ML IV SOSY
PREFILLED_SYRINGE | INTRAVENOUS | Status: DC | PRN
  Administered 2018-08-08: 100 mg via INTRAVENOUS

## 2018-08-08 MED ORDER — PROPOFOL 500 MG/50ML IV EMUL
INTRAVENOUS | Status: AC
  Filled 2018-08-08: qty 50

## 2018-08-08 MED ORDER — MIDAZOLAM HCL 2 MG/2ML IJ SOLN
INTRAMUSCULAR | Status: DC | PRN
  Administered 2018-08-08: 2 mg via INTRAVENOUS

## 2018-08-08 NOTE — H&P (Signed)
Jonathon Bellows, MD [address] [place], [place], [place], [address] [address], [place], [place], [place], [address] Phone: [phone]  Fax: [phone]  Primary Care Physician:  Willey Blade, MD   Pre-Procedure History & Physical: HPI:  NATALYIA INNES is a 62 y.o. female is here for an endoscopy and colonoscopy    Past Medical History:  Diagnosis Date  . Cancer ([facility])    skin ca  . Complication of anesthesia   . History of kidney stones   . Hypercholesteremia   . Hypertension     Past Surgical History:  Procedure Laterality Date  . ANKLE SURGERY      Prior to Admission medications   Medication Sig Start Date End Date Taking? Authorizing Provider  aspirin (ASPIR-LOW) 81 MG EC tablet Aspir-Low 81 mg tablet,delayed release  Take 1 tablet every day by oral route.   Yes [provider]  Coenzyme Q10 (CO Q-10) 100 MG CAPS Take 1 tablet by mouth daily.   Yes [provider]  fexofenadine (ALLEGRA) 180 MG tablet Take by mouth.   Yes [provider]  MAGNESIUM PO Take 1 tablet by mouth daily.   Yes [provider]  Misc Natural Products (OSTEO BI-FLEX JOINT SHIELD) TABS Take 1 tablet by mouth daily.   Yes [provider]  PAZEO 0.7 % SOLN  05/13/18  Yes [provider]  Probiotic Product (PROBIOTIC PO) Take 1 tablet by mouth daily.   Yes [provider]  rosuvastatin (CRESTOR) 10 MG tablet Take 1 tablet (10 mg total) by mouth daily. 08/01/18 10/30/18 Yes Wellington Hampshire, MD  valsartan (DIOVAN) 80 MG tablet Take 1 tablet (80 mg total) by mouth daily. 08/07/18  Yes Wellington Hampshire, MD    Allergies as of 05/05/2018 - Review Complete 11/06/2016  Allergen Reaction Noted  . Cephalexin Hives and Nausea Only 03/11/2014  . Penicillins Hives and Itching 03/11/2014  . Cefdinir Hives 03/11/2014  . Oxycodone-acetaminophen Nausea Only 03/11/2014    Family History  Problem Relation Age of Onset  .  Hyperlipidemia Mother   . Breast cancer Mother 71       twice also age 32  . Arrhythmia Mother   . Atrial fibrillation Mother   . Hypertension Mother   . Hypertension Father   . Breast cancer Sister 52    Social History   Socioeconomic History  . Marital status: Married    Spouse name: Not on file  . Number of children: Not on file  . Years of education: Not on file  . Highest education level: Not on file  Occupational History  . Not on file  Social Needs  . Financial resource strain: Not on file  . Food insecurity:    Worry: Not on file    Inability: Not on file  . Transportation needs:    Medical: Not on file    Non-medical: Not on file  Tobacco Use  . Smoking status: Never Smoker  . Smokeless tobacco: Never Used  Substance and Sexual Activity  . Alcohol use: No  . Drug use: No  . Sexual activity: Not on file  Lifestyle  . Physical activity:    Days per week: Not on file    Minutes per session: Not on file  . Stress: Not on file  Relationships  . Social connections:    Talks on phone: Not on file    Gets together: Not on file  Attends religious service: Not on file    Active member of club or organization: Not on file    Attends meetings of clubs or organizations: Not on file    Relationship status: Not on file  . Intimate partner violence:    Fear of current or ex partner: Not on file    Emotionally abused: Not on file    Physically abused: Not on file    Forced sexual activity: Not on file  Other Topics Concern  . Not on file  Social History Narrative  . Not on file    Review of Systems: See HPI, otherwise negative ROS  Physical Exam: BP (!) 142/93   Pulse 68   Temp (!) 96.9 F (36.1 C) (Tympanic)   Resp 18   Ht 5\' 7"  (1.702 m)   Wt 77.1 kg   SpO2 100%   BMI 26.63 kg/m  General:   Alert,  pleasant and cooperative in NAD Head:  Normocephalic and atraumatic. Neck:  Supple; no masses or thyromegaly. Lungs:  Clear throughout to  auscultation, normal respiratory effort.    Heart:  +S1, +S2, Regular rate and rhythm, No edema. Abdomen:  Soft, nontender and nondistended. Normal bowel sounds, without guarding, and without rebound.   Neurologic:  Alert and  oriented x4;  grossly normal neurologically.  Impression/Plan: COURTANY MCMURPHY is here for an endoscopy and colonoscopy  to be performed for  evaluation of dysphagia and colon cancer screening     Risks, benefits, limitations, and alternatives regarding endoscopy have been reviewed with the patient.  Questions have been answered.  All parties agreeable.   Jonathon Bellows, MD  08/08/2018, 8:20 AM

## 2018-08-08 NOTE — Anesthesia Procedure Notes (Signed)
Performed by: Carmelo Reidel, CRNA Pre-anesthesia Checklist: Patient identified, Emergency Drugs available, Suction available, Patient being monitored and Timeout performed Patient Re-evaluated:Patient Re-evaluated prior to induction Oxygen Delivery Method: Nasal cannula Induction Type: IV induction       

## 2018-08-08 NOTE — Addendum Note (Signed)
Addended by: Vanessa Ralphs on: 08/08/2018 01:11 PM   Modules accepted: Orders

## 2018-08-08 NOTE — Op Note (Signed)
[facility] Gastroenterology Patient Name: Roberta Collins Procedure Date: 08/08/2018 8:23 AM MRN: [id number] Account #: [id number] Date of Birth: 01-13-1956 Admit Type: Outpatient Age: 62 Room: [facility] ENDO ROOM 4 Gender: Female Note Status: Finalized Procedure:            Colonoscopy Indications:          Screening for colorectal malignant neoplasm Providers:            Jonathon Bellows MD, MD Referring MD:         Royetta Crochet. Karlton Lemon, MD (Referring MD) Medicines:            Monitored Anesthesia Care Complications:        No immediate complications. Procedure:            Pre-Anesthesia Assessment:                       - Prior to the procedure, a History and Physical was                        performed, and patient medications, allergies and                        sensitivities were reviewed. The patient's tolerance of                        previous anesthesia was reviewed.                       - The risks and benefits of the procedure and the                        sedation options and risks were discussed with the                        patient. All questions were answered and informed                        consent was obtained.                       - ASA Grade Assessment: II - A patient with mild                        systemic disease.                       After obtaining informed consent, the colonoscope was                        passed under direct vision. Throughout the procedure,                        the patient's blood pressure, pulse, and oxygen                        saturations were monitored continuously. The                        Colonoscope was introduced through the anus and  advanced to the the cecum, identified by the                        appendiceal orifice, IC valve and transillumination.                        The colonoscopy was performed with ease. The patient                        tolerated the procedure well.  The quality of the bowel                        preparation was good. Findings:      The entire examined colon appeared normal on direct and retroflexion       views. Impression:           - The entire examined colon is normal on direct and                        retroflexion views.                       - No specimens collected. Recommendation:       - Discharge patient to home (with escort).                       - Resume previous diet.                       - Continue present medications.                       - Repeat colonoscopy in 10 years for screening purposes. Procedure Code(s):    --- Professional ---                       [phone], Colonoscopy, flexible; diagnostic, including                        collection of specimen(s) by brushing or washing, when                        performed (separate procedure) Diagnosis Code(s):    --- Professional ---                       Z12.11, Encounter for screening for malignant neoplasm                        of colon CPT copyright 2017 American Medical Association. All rights reserved. The codes documented in this report are preliminary and upon coder review may  be revised to meet current compliance requirements. Jonathon Bellows, MD Jonathon Bellows MD, MD 08/08/2018 8:49:58 AM This report has been signed electronically. Number of Addenda: 0 Note Initiated On: 08/08/2018 8:23 AM Scope Withdrawal Time: 0 hours 9 minutes 47 seconds  Total Procedure Duration: 0 hours 12 minutes 0 seconds       [facility]

## 2018-08-08 NOTE — Telephone Encounter (Signed)
Patient would like a call back for the reason why the dose on the new medication we called in is higher that the Telmisartan   Please advise

## 2018-08-08 NOTE — Telephone Encounter (Signed)
Med list updated

## 2018-08-08 NOTE — Anesthesia Post-op Follow-up Note (Signed)
Anesthesia QCDR form completed.        

## 2018-08-08 NOTE — Telephone Encounter (Signed)
Yes that is fine

## 2018-08-08 NOTE — Telephone Encounter (Signed)
S/w patient. She went to her pharmacy to pick up her medication and did not realize her Micardis had been substituted with valsartan.  She has some Micardis 40 mg tablets left at home that she wants to take 1/2 tablet daily instead of starting the Valsartan. Then hopefully in the next few months, the Micardis will be off backorder. Advised patient that should be fine. I will make Dr Fletcher Anon aware and call her back if there is any problem with this. She was appreciative.   Routing to Dr Fletcher Anon.

## 2018-08-08 NOTE — Transfer of Care (Signed)
Immediate Anesthesia Transfer of Care Note  Patient: Roberta Collins  Procedure(s) Performed: COLONOSCOPY WITH PROPOFOL (N/A ) ESOPHAGOGASTRODUODENOSCOPY (EGD) WITH PROPOFOL (N/A )  Patient Location: PACU  Anesthesia Type:General  Level of Consciousness: sedated  Airway & Oxygen Therapy: Patient Spontanous Breathing and Patient connected to nasal cannula oxygen  Post-op Assessment: Report given to RN and Post -op Vital signs reviewed and stable  Post vital signs: Reviewed and stable  Last Vitals:  Vitals Value Taken Time  BP 105/61 08/08/2018  8:53 AM  Temp    Pulse 73 08/08/2018  8:53 AM  Resp 12 08/08/2018  8:53 AM  SpO2 98 % 08/08/2018  8:53 AM    Last Pain:  Vitals:   08/08/18 0749  TempSrc: Tympanic  PainSc: 0-No pain         Complications: No apparent anesthesia complications

## 2018-08-08 NOTE — Anesthesia Postprocedure Evaluation (Signed)
Anesthesia Post Note  Patient: Roberta Collins  Procedure(s) Performed: COLONOSCOPY WITH PROPOFOL (N/A ) ESOPHAGOGASTRODUODENOSCOPY (EGD) WITH PROPOFOL (N/A )  Patient location during evaluation: PACU Anesthesia Type: General Level of consciousness: awake and alert Pain management: pain level controlled Vital Signs Assessment: post-procedure vital signs reviewed and stable Respiratory status: spontaneous breathing, nonlabored ventilation, respiratory function stable and patient connected to nasal cannula oxygen Cardiovascular status: blood pressure returned to baseline and stable Postop Assessment: no apparent nausea or vomiting Anesthetic complications: no     Last Vitals:  Vitals:   08/08/18 0910 08/08/18 0920  BP: 125/73 123/66  Pulse: 75 67  Resp: 16 12  Temp:    SpO2: 98% 100%    Last Pain:  Vitals:   08/08/18 0850  TempSrc: Tympanic  PainSc:                  [name]

## 2018-08-08 NOTE — Anesthesia Preprocedure Evaluation (Signed)
Anesthesia Evaluation  Patient identified by MRN, date of birth, ID band Patient awake    Reviewed: Allergy & Precautions, H&P , NPO status , Patient's Chart, lab work & pertinent test results, reviewed documented beta blocker date and time   History of Anesthesia Complications (+) history of anesthetic complications  Airway Mallampati: II   Neck ROM: full    Dental  (+) Poor Dentition   Pulmonary neg pulmonary ROS,    Pulmonary exam normal        Cardiovascular hypertension, negative cardio ROS Normal cardiovascular exam Rhythm:regular Rate:Normal     Neuro/Psych negative neurological ROS  negative psych ROS   GI/Hepatic negative GI ROS, Neg liver ROS,   Endo/Other  negative endocrine ROS  Renal/GU negative Renal ROS  negative genitourinary   Musculoskeletal   Abdominal   Peds  Hematology negative hematology ROS (+)   Anesthesia Other Findings Past Medical History: No date: Cancer ([place])     Comment:  skin ca No date: Complication of anesthesia No date: History of kidney stones No date: Hypercholesteremia No date: Hypertension Past Surgical History: No date: ANKLE SURGERY BMI    Body Mass Index:  26.63 kg/m     Reproductive/Obstetrics negative OB ROS                             Anesthesia Physical Anesthesia Plan  ASA: II  Anesthesia Plan: General   Post-op Pain Management:    Induction:   PONV Risk Score and Plan:   Airway Management Planned:   Additional Equipment:   Intra-op Plan:   Post-operative Plan:   Informed Consent: I have reviewed the patients History and Physical, chart, labs and discussed the procedure including the risks, benefits and alternatives for the proposed anesthesia with the patient or authorized representative who has indicated his/her understanding and acceptance.   Dental Advisory Given  Plan Discussed with: CRNA  Anesthesia Plan  Comments:         Anesthesia Quick Evaluation

## 2018-08-08 NOTE — Op Note (Signed)
[facility] Gastroenterology Patient Name: Roberta Collins Procedure Date: 08/08/2018 8:26 AM MRN: [id number] Account #: [id number] Date of Birth: 04/26/1956 Admit Type: Outpatient Age: 63 Room: [facility] ENDO ROOM 4 Gender: Female Note Status: Finalized Procedure:            Upper GI endoscopy Indications:          Dysphagia Providers:            Jonathon Bellows MD, MD Referring MD:         Royetta Crochet. Karlton Lemon, MD (Referring MD) Medicines:            Monitored Anesthesia Care Complications:        No immediate complications. Procedure:            Pre-Anesthesia Assessment:                       - Prior to the procedure, a History and Physical was                        performed, and patient medications, allergies and                        sensitivities were reviewed. The patient's tolerance of                        previous anesthesia was reviewed.                       - The risks and benefits of the procedure and the                        sedation options and risks were discussed with the                        patient. All questions were answered and informed                        consent was obtained.                       - ASA Grade Assessment: II - A patient with mild                        systemic disease.                       After obtaining informed consent, the endoscope was                        passed under direct vision. Throughout the procedure,                        the patient's blood pressure, pulse, and oxygen                        saturations were monitored continuously. The Endoscope                        was introduced through the mouth, and advanced to the  third part of duodenum. The upper GI endoscopy was                        accomplished with ease. The patient tolerated the                        procedure well. Findings:      The examined duodenum was normal.      The stomach was normal.      The cardia and  gastric fundus were normal on retroflexion.      The examined esophagus was normal. Biopsies were obtained from the       proximal and distal esophagus with cold forceps for histology of       suspected eosinophilic esophagitis. Impression:           - Normal examined duodenum.                       - Normal stomach.                       - Normal esophagus. Biopsied. Recommendation:       - Await pathology results.                       - Perform a colonoscopy today. Procedure Code(s):    --- Professional ---                       [phone], Esophagogastroduodenoscopy, flexible, transoral;                        with biopsy, single or multiple Diagnosis Code(s):    --- Professional ---                       R13.10, Dysphagia, unspecified CPT copyright 2017 American Medical Association. All rights reserved. The codes documented in this report are preliminary and upon coder review may  be revised to meet current compliance requirements. Jonathon Bellows, MD Jonathon Bellows MD, MD 08/08/2018 8:34:54 AM This report has been signed electronically. Number of Addenda: 0 Note Initiated On: 08/08/2018 8:26 AM      [facility]

## 2018-08-10 ENCOUNTER — Encounter: Payer: Self-pay | Admitting: Gastroenterology

## 2018-08-12 LAB — SURGICAL PATHOLOGY

## 2018-08-17 ENCOUNTER — Encounter: Payer: Self-pay | Admitting: Gastroenterology

## 2018-08-18 ENCOUNTER — Other Ambulatory Visit (INDEPENDENT_AMBULATORY_CARE_PROVIDER_SITE_OTHER): Payer: BLUE CROSS/BLUE SHIELD | Admitting: *Deleted

## 2018-08-18 ENCOUNTER — Encounter: Payer: Self-pay | Admitting: Gastroenterology

## 2018-08-18 ENCOUNTER — Telehealth: Payer: Self-pay | Admitting: Gastroenterology

## 2018-08-18 DIAGNOSIS — I10 Essential (primary) hypertension: Secondary | ICD-10-CM | POA: Diagnosis not present

## 2018-08-18 DIAGNOSIS — E785 Hyperlipidemia, unspecified: Secondary | ICD-10-CM

## 2018-08-18 NOTE — Telephone Encounter (Signed)
error 

## 2018-08-18 NOTE — Telephone Encounter (Signed)
Please call Roberta Collins, as she doesn't understand the pathology report and has some questions.

## 2018-08-19 LAB — LIPID PANEL
CHOL/HDL RATIO: 2.3 ratio (ref 0.0–4.4)
Cholesterol, Total: 195 mg/dL (ref 100–199)
HDL: 86 mg/dL (ref >39)
LDL CALC: 99 mg/dL (ref 0–99)
TRIGLYCERIDES: 48 mg/dL (ref 0–149)
VLDL CHOLESTEROL CAL: 10 mg/dL (ref 5–40)

## 2018-08-19 LAB — HEPATIC FUNCTION PANEL
ALT: 17 IU/L (ref 0–32)
AST: 19 IU/L (ref 0–40)
Albumin: 4.4 g/dL (ref 3.6–4.8)
Alkaline Phosphatase: 90 IU/L (ref 39–117)
Bilirubin, Direct: 0.07 mg/dL (ref 0.00–0.40)
Total Protein: 6.6 g/dL (ref 6.0–8.5)

## 2018-08-21 ENCOUNTER — Telehealth: Payer: Self-pay | Admitting: Gastroenterology

## 2018-08-21 NOTE — Telephone Encounter (Signed)
Patient called in wanting someone to go over her results she received from her upper Endoscopy & Colonoscopy completed by Dr Vicente Males. Please advise patient.

## 2018-08-21 NOTE — Telephone Encounter (Signed)
Spoke with pt and informed her of results and Dr. Georgeann Oppenheim suggestions.

## 2018-08-21 NOTE — Telephone Encounter (Signed)
-----   Message from Jonathon Bellows, MD sent at 08/17/2018  6:23 PM EDT ----- Regarding: RE: Pt request for biopsy result Inform biopsies were normal. Enquire if she is still having issues with swallowing . Check if she is taking her PPI.   IF on PPI and still has issues swallowing can do manometry or if she wishes to wait and watch can do so too for a few months  Check if she wants to see me in the office to discuss further   ----- Message ----- From: Rushie Chestnut, [place]: 08/15/2018   2:46 PM EDT To: Jonathon Bellows, MD Subject: Pt request for biopsy result                   Dr. Vicente Males,  Roberta Collins called for biopsy results. According to your plan in her last office visit note you are considering an esophageal manometry/pH depending on results. Will this procedure be needed?  Please advise. Roberta Collins

## 2018-08-21 NOTE — Telephone Encounter (Signed)
Spoke with pt and informed her of biopsy results and Dr. Georgeann Oppenheim suggestions. Pt states she will continue with her PPI and will contact our office if she continues to have trouble swallowing.

## 2018-09-01 ENCOUNTER — Encounter: Payer: Self-pay | Admitting: Nurse Practitioner

## 2018-09-01 ENCOUNTER — Ambulatory Visit (INDEPENDENT_AMBULATORY_CARE_PROVIDER_SITE_OTHER): Payer: BLUE CROSS/BLUE SHIELD | Admitting: Nurse Practitioner

## 2018-09-01 VITALS — BP 132/64 | HR 56 | Ht 67.0 in | Wt 180.8 lb

## 2018-09-01 DIAGNOSIS — R931 Abnormal findings on diagnostic imaging of heart and coronary circulation: Secondary | ICD-10-CM

## 2018-09-01 DIAGNOSIS — E782 Mixed hyperlipidemia: Secondary | ICD-10-CM

## 2018-09-01 DIAGNOSIS — I10 Essential (primary) hypertension: Secondary | ICD-10-CM | POA: Diagnosis not present

## 2018-09-01 NOTE — Progress Notes (Signed)
Office Visit    Patient Name: Roberta Collins Date of Encounter: 09/01/2018  Primary Care Provider:  Willey Blade, MD Primary Cardiologist:  Kathlyn Sacramento, MD  Chief Complaint    62 year old female with a history of hypertension, hyperlipidemia, nephrolithiasis, and skin cancer who presents for follow-up after recent cardiac CT for calcium scoring and stress testing.  Past Medical History    Past Medical History:  Diagnosis Date  . Cancer ([place])    skin ca  . Complication of anesthesia   . Coronary artery calcification seen on CAT scan    a. 07/10/2018 Cardiac CT: Cor Ca2+ = 446.  96th %'ile; b. 07/2018 ETT: Ex: 5:43, Max HR 151, HTN response (206/82). No ECG changes/ischemia.  Marland Kitchen History of kidney stones   . Hypercholesteremia   . Hypertension    Past Surgical History:  Procedure Laterality Date  . ANKLE SURGERY    . COLONOSCOPY WITH PROPOFOL N/A 08/08/2018   Procedure: COLONOSCOPY WITH PROPOFOL;  Surgeon: Jonathon Bellows, MD;  Location: [facility] ENDOSCOPY;  Service: Gastroenterology;  Laterality: N/A;  . ESOPHAGOGASTRODUODENOSCOPY (EGD) WITH PROPOFOL N/A 08/08/2018   Procedure: ESOPHAGOGASTRODUODENOSCOPY (EGD) WITH PROPOFOL;  Surgeon: Jonathon Bellows, MD;  Location: [facility] ENDOSCOPY;  Service: Gastroenterology;  Laterality: N/A;    Allergies  Allergies  Allergen Reactions  . Cephalexin Hives and Nausea Only    Swelling itching  . Penicillins Hives and Itching    Has patient had a PCN reaction causing immediate rash, facial/tongue/throat swelling, SOB or lightheadedness with hypotension: YES Has patient had a PCN reaction causing severe rash involving mucus membranes or skin necrosis: NO Has patient had a PCN reaction that required hospitalization NO Has patient had a PCN reaction occurring within the last 10 years: NO If all of the above answers are "NO", then may proceed with Cephalosporin use.  . Cefdinir Hives  . Oxycodone-Acetaminophen Nausea Only    History of Present  Illness    62 year old female with a above past medical history including hypertension, hyperlipidemia, nephrolithiasis, and skin cancer.  She was recently evaluated by Dr. Fletcher Anon in the setting of orthostasis requiring reduction in Micardis dosing.  She was also interested cardiovascular risk evaluation.   Regarding her blood pressure, her Micardis was discontinued in favor of low-dose amlodipine however, this caused lower extremity swelling and following phone call to the office, this was discontinued and she was plan to resume Micardis 20 mill grams daily.  This was on back order and as result, valsartan was ordered instead.  However, she still has plenty of Micardis 40 mg tablets at home and has resumed at 20 mg without any recurrent orthostasis, symptoms, or limitations.  She underwent cardiac CT for calcium scoring which resulted in an elevated calcium score 446, placing her in the 96th percentile.  This was followed by exercise treadmill testing which showed normal exercise tolerance with hypertensive response to exercise.  There were no no ECG changes suggestive of ischemia.  Following finding of cardiac CT, she was placed on rosuvastatin 20 mg daily.  This resulted in aching of her hips and shoulders and the dose was reduced to 10 mg daily.  Since then, she has been feeling well and is tolerating medications well.  She remains active and is walking most days of the week.  She just walked a trail 8 miles yesterday without any symptoms or limitations.  She denies chest pain, dyspnea, palpitations, PND, orthopnea, dizziness, syncope, edema, or early satiety.  Home Medications  Prior to Admission medications   Medication Sig Start Date End Date Taking? Authorizing Provider  aspirin (ASPIR-LOW) 81 MG EC tablet Aspir-Low 81 mg tablet,delayed release  Take 1 tablet every day by oral route.   Yes [provider]  Coenzyme Q10 (CO Q-10) 100 MG CAPS Take 1 tablet by mouth daily.   Yes [provider]  fexofenadine (ALLEGRA) 180 MG tablet Take by mouth.   Yes [provider]  MAGNESIUM PO Take 1 tablet by mouth daily.   Yes [provider]  Misc Natural Products (OSTEO BI-FLEX JOINT SHIELD) TABS Take 1 tablet by mouth daily.   Yes [provider]  PAZEO 0.7 % SOLN  05/13/18  Yes [provider]  Probiotic Product (PROBIOTIC PO) Take 1 tablet by mouth daily.   Yes [provider]  rosuvastatin (CRESTOR) 10 MG tablet Take 1 tablet (10 mg total) by mouth daily. 08/01/18 10/30/18 Yes Wellington Hampshire, MD  telmisartan (MICARDIS) 40 MG tablet Take 0.5 tablets (20 mg total) by mouth daily. 08/08/18  Yes Wellington Hampshire, MD    Review of Systems    She denies chest pain, palpitations, dyspnea, pnd, orthopnea, n, v, dizziness, syncope, edema, weight gain, or early satiety.  All other systems reviewed and are otherwise negative except as noted above.  Physical Exam    VS:  BP 132/64 (BP Location: Left Arm, Patient Position: Sitting, Cuff Size: Normal)   Pulse (!) 56   Ht 5\' 7"  (1.702 m)   Wt 180 lb 12 oz (82 kg)   BMI 28.31 kg/m  , BMI Body mass index is 28.31 kg/m. GEN: Well nourished, well developed, in no acute distress. HEENT: normal. Neck: Supple, no JVD, carotid bruits, or masses. Cardiac: RRR, no murmurs, rubs, or gallops. No clubbing, cyanosis, edema.  Radials/DP/PT 2+ and equal bilaterally.  Respiratory:  Respirations regular and unlabored, clear to auscultation bilaterally. GI: Soft, nontender, nondistended, BS + x 4. MS: no deformity or atrophy. Skin: warm and dry, no rash. Neuro:  Strength and sensation are intact. Psych: Normal affect.  Accessory Clinical Findings    ECG personally reviewed by me today -sinus bradycardia/sinus arrhythmia, 56, T wave inversion V1, V2, no acute ST or T changes - no acute changes.  Assessment & Plan    1.  Essential hypertension: Comp located by orthostasis in the setting of  increased activity with weight loss requiring reduction of antihypertensive therapy.  She is now doing Micardis 20 mg daily after trying amlodipine 2.5 mg daily.  Unfortunately, that caused lower extremity swelling.  On Micardis 20 mg daily, she is doing well without any recurrent orthostasis/lightheadedness.  No changes today.  2.  Coronary calcium noted on CT: Status post stress testing which was nonischemic.  No history of chest pain or dyspnea.  She is quite active and walks regularly.  I encouraged her to remain physically active.  She is on aspirin and statin therapy.  3.  Hyperlipidemia: She recently had follow-up lipids on September 30.  LDL 99 with normal LFTs.  Continue statin therapy.  Of note, she did not tolerate rosuvastatin 20 mg but does tolerate 10 mg.  4.  Disposition: Patient will follow-up with Dr. Fletcher Anon as needed.  Murray Hodgkins, NP 09/01/2018, 5:01 PM

## 2018-09-01 NOTE — Patient Instructions (Signed)
Medication Instructions:  Your physician recommends that you continue on your current medications as directed. Please refer to the Current Medication list given to you today.  Lab work: none  Testing/Procedures: none  Follow-Up: At [facility], you and your health needs are our priority.  As part of our continuing mission to provide you with exceptional heart care, we have created designated Provider Care Teams.  These Care Teams include your primary Cardiologist (physician) and Advanced Practice Providers (APPs -  Physician Assistants and Nurse Practitioners) who all work together to provide you with the care you need, when you need it.  You will need a follow up appointment ON AN AS NEEDED BASIS.  You may see Kathlyn Sacramento, MD or one of the following Advanced Practice Providers on your designated Care Team:   Murray Hodgkins, NP Christell Faith, PA-C . Marrianne Mood, PA-C

## 2019-02-21 ENCOUNTER — Telehealth: Payer: BLUE CROSS/BLUE SHIELD | Admitting: Physician Assistant

## 2019-02-21 DIAGNOSIS — R300 Dysuria: Secondary | ICD-10-CM | POA: Diagnosis not present

## 2019-02-21 DIAGNOSIS — E559 Vitamin D deficiency, unspecified: Secondary | ICD-10-CM | POA: Insufficient documentation

## 2019-02-21 DIAGNOSIS — E785 Hyperlipidemia, unspecified: Secondary | ICD-10-CM | POA: Insufficient documentation

## 2019-02-21 MED ORDER — SULFAMETHOXAZOLE-TRIMETHOPRIM 800-160 MG PO TABS: 1.0000 | ORAL_TABLET | Freq: Two times a day (BID) | ORAL | 0 refills | Status: DC

## 2019-02-21 NOTE — Progress Notes (Signed)

## 2019-02-21 NOTE — Progress Notes (Signed)
I have spent 5 minutes in review of e-visit questionnaire, review and updating patient chart, medical decision making and response to patient.   Janyce Ellinger Cody Shalese Strahan, PA-C    

## 2019-02-27 ENCOUNTER — Telehealth: Payer: BC Managed Care – PPO | Admitting: Family

## 2019-02-27 DIAGNOSIS — L239 Allergic contact dermatitis, unspecified cause: Secondary | ICD-10-CM | POA: Diagnosis not present

## 2019-02-27 MED ORDER — NITROFURANTOIN MONOHYD MACRO 100 MG PO CAPS: 100.0000 mg | ORAL_CAPSULE | Freq: Two times a day (BID) | ORAL | 0 refills | Status: DC

## 2019-02-27 MED ORDER — PREDNISONE 10 MG (21) PO TBPK: ORAL_TABLET | ORAL | 0 refills | Status: DC

## 2019-02-27 NOTE — Progress Notes (Signed)
Ms. Poulter, I agree it appears you are having an allergic reaction to the Bactrim. Stop the medication. Take benadryl as needed to help with itching and clearing the rash. I will send in a different antibiotic for you, Macrobid twice a day. Be sure to remind your primary care provider at your next office visit. Feel better soon!

## 2019-02-27 NOTE — Addendum Note (Signed)
Addended by: Dutch Quint B on: 02/27/2019 09:28 AM   Modules accepted: Orders

## 2019-04-04 IMAGING — CT CT HEART SCORING
2 series · 16 of 20 positions shown, 18 images · non-contrast
Comparison: None.

:
Risk stratification

EXAM:
Coronary Calcium Score
TECHNIQUE: The patient was scanned on a Siemens Somatom 64 slice scanner. Axial
non-contrast 3 mm slices were carried out through the heart. The
data set was analyzed on a dedicated work station and scored using
the Agatson method.

[Series 2: casc 3.0 i36f 2 bestdiast 68 % · axial · 0.35mm/px · z∈[-241,-130]mm · 8 of 49 slices shown, 10 images]
[im 6/49  vessel]
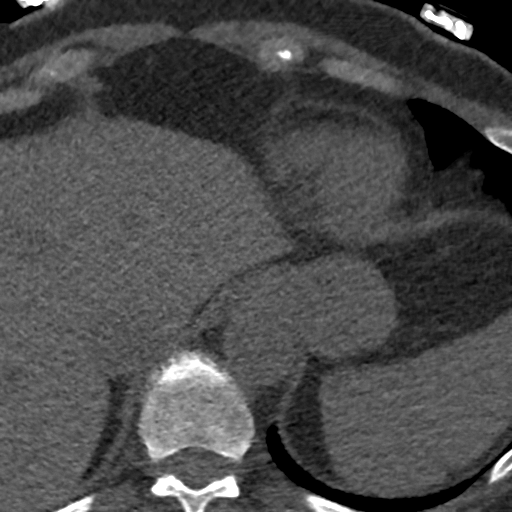
[im 6/49  lung]
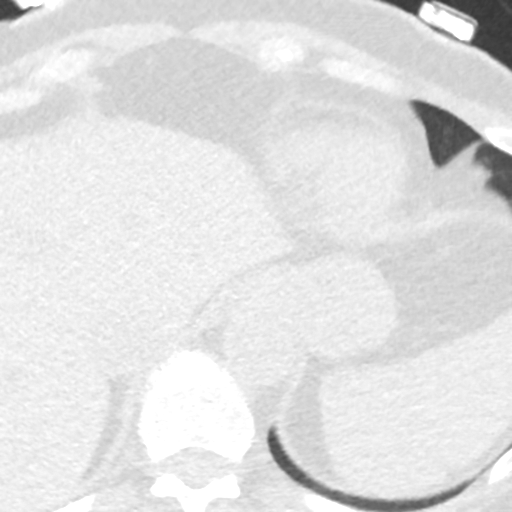
[im 11/49  vessel]
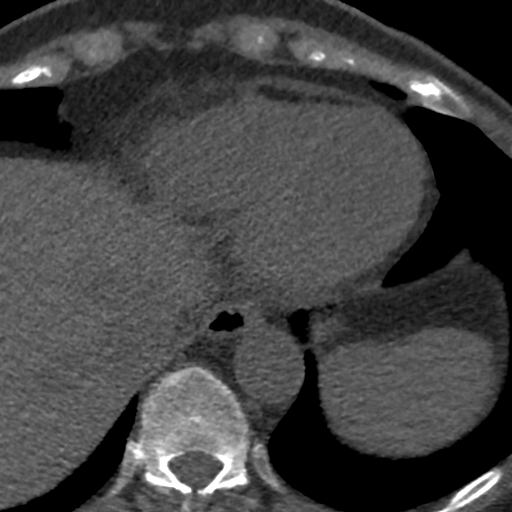
[im 17/49  vessel]
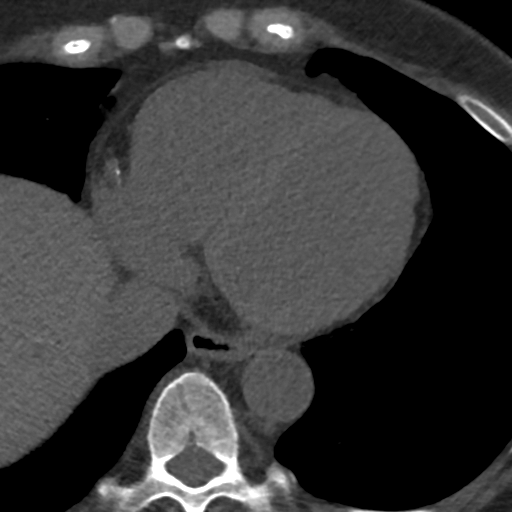
[im 22/49  vessel]
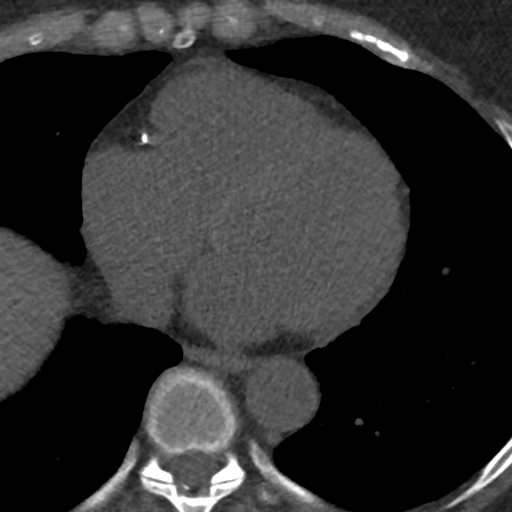
[im 27/49  vessel]
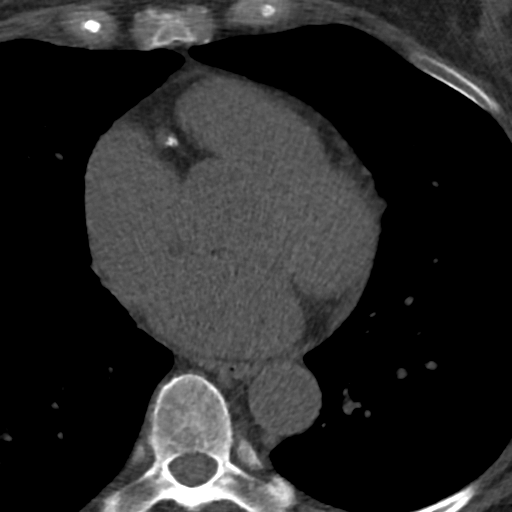
[im 27/49  lung]
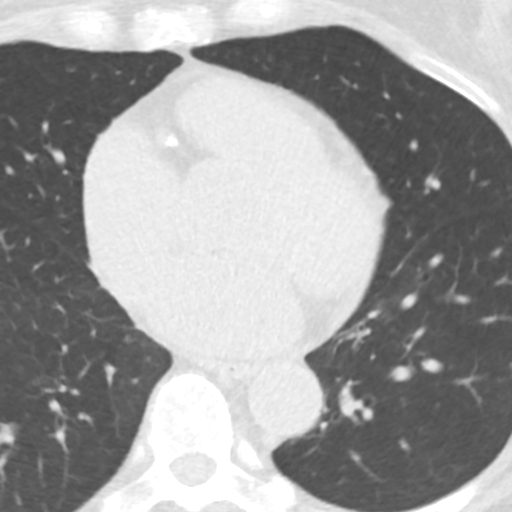
[im 33/49  vessel]
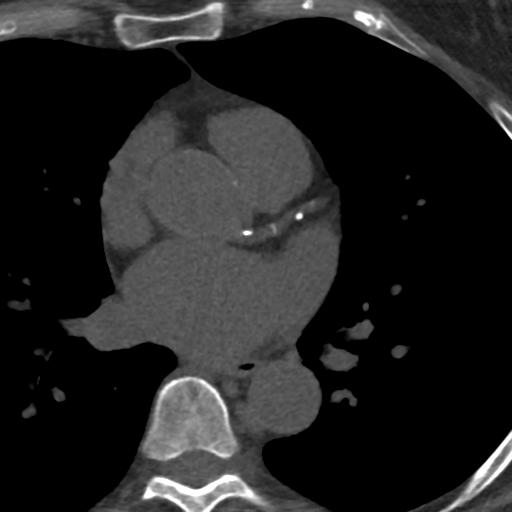
[im 38/49  vessel]
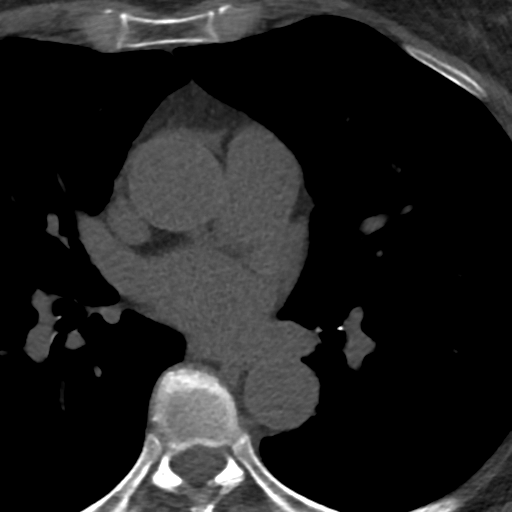
[im 43/49  vessel]
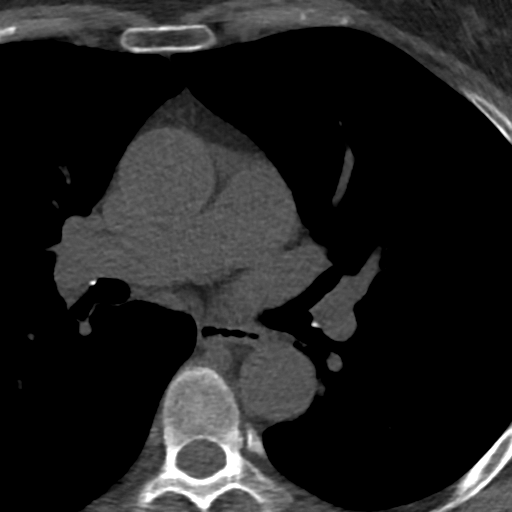

[Series 4: lung st 68 % · axial · 0.68mm/px · z∈[-241,-130]mm · 8 of 49 slices shown]
[im 6/49  lung]
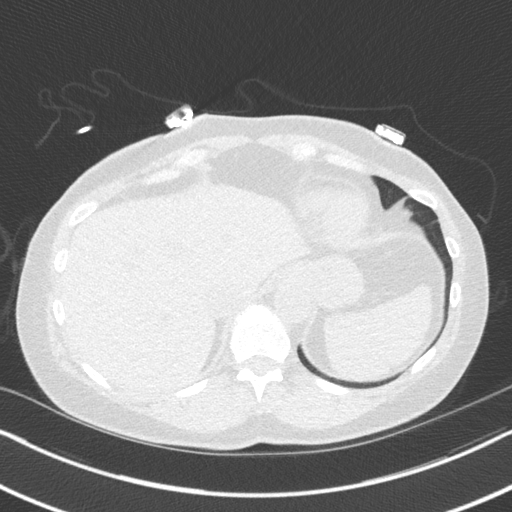
[im 11/49  lung]
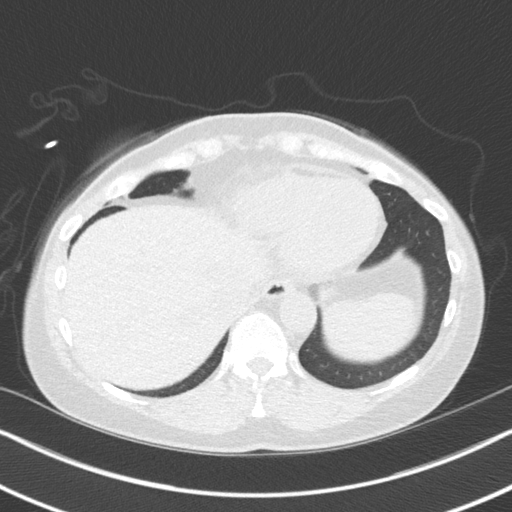
[im 17/49  lung]
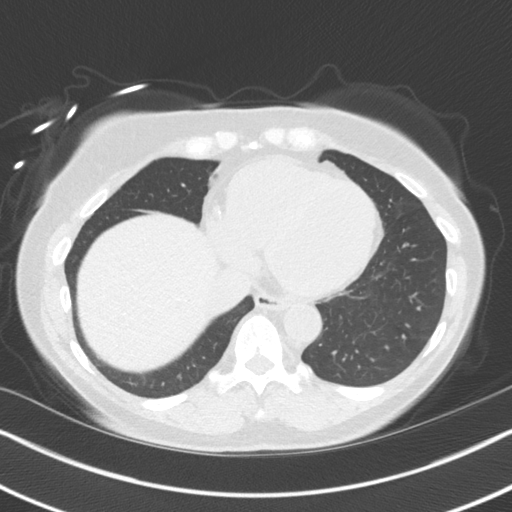
[im 22/49  lung]
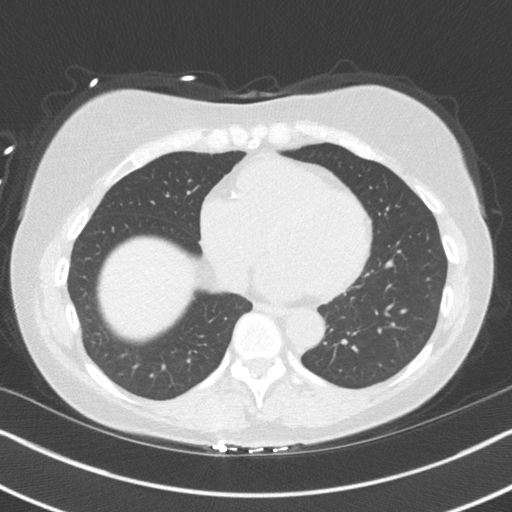
[im 27/49  lung]
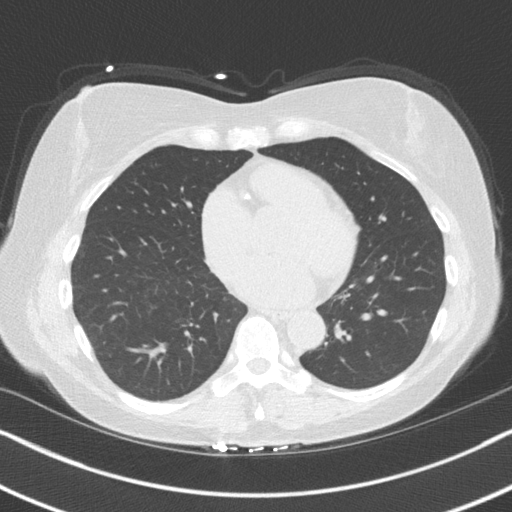
[im 33/49  lung]
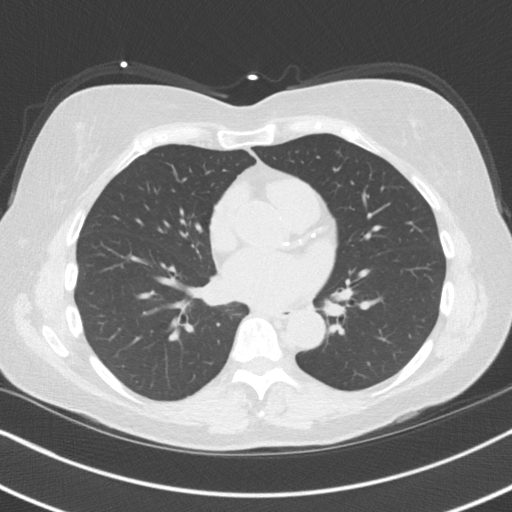
[im 38/49  lung]
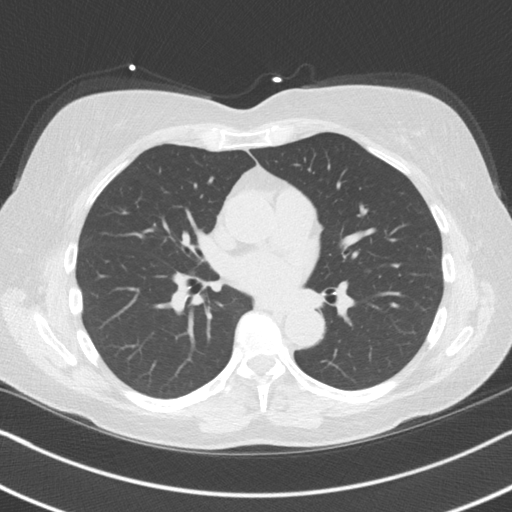
[im 43/49  lung]
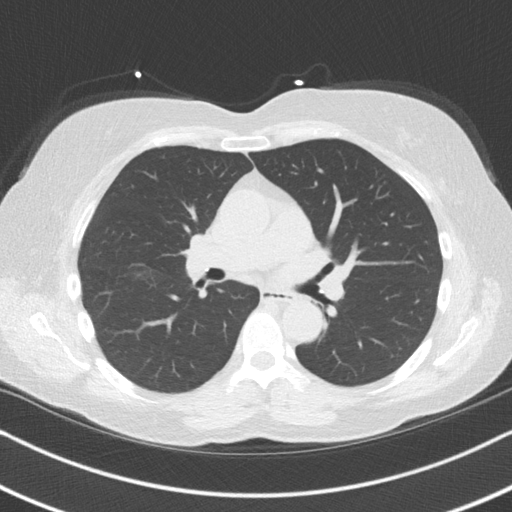

[16 of 20 positions shown; findings below may reference images not displayed]

FINDINGS: Non-cardiac: See separate report from [REDACTED].

Ascending Aorta: Normal diameter 3.4 cm

Pericardium: Normal

Coronary arteries: Calcium noted in LM, proximal and mid LAD and
through out the RCA
IMPRESSION: Coronary calcium score of 446 . This was 96 th percentile for age
and sex matched control.

Daniel Matsumoto

EXAM:
OVER-READ INTERPRETATION  CT CHEST

The following report is an over-read performed by radiologist Dr.
Kanthe Yehea [REDACTED] on 07/10/2018. This over-read
does not include interpretation of cardiac or coronary anatomy or
pathology. The coronary calcium score interpretation by the
cardiologist is attached.
FINDINGS: Vascular: Heart is normal size.  Visualized aorta is normal caliber.

Mediastinum/Nodes: No adenopathy in the lower mediastinum or hila.

Lungs/Pleura: Visualized lungs clear.  No effusions.

Upper Abdomen: Imaging into the upper abdomen shows no acute
findings. Low-density lesion within the left hepatic lobe measures
3.2 cm, likely cyst.

Musculoskeletal: Chest wall soft tissues are unremarkable. No acute
bony abnormality.
IMPRESSION: No acute or significant extracardiac abnormality.

## 2019-06-22 ENCOUNTER — Other Ambulatory Visit: Payer: Self-pay | Admitting: Internal Medicine

## 2019-06-22 DIAGNOSIS — Z1231 Encounter for screening mammogram for malignant neoplasm of breast: Secondary | ICD-10-CM

## 2019-06-30 ENCOUNTER — Other Ambulatory Visit: Payer: Self-pay

## 2019-06-30 ENCOUNTER — Ambulatory Visit
Admission: RE | Admit: 2019-06-30 | Discharge: 2019-06-30 | Disposition: A | Discharge status: Home / Self Care | Payer: BC Managed Care – PPO | Source: Ambulatory Visit | Attending: Internal Medicine | Admitting: Internal Medicine

## 2019-06-30 DIAGNOSIS — Z1231 Encounter for screening mammogram for malignant neoplasm of breast: Secondary | ICD-10-CM

## 2019-07-02 ENCOUNTER — Ambulatory Visit: Payer: BLUE CROSS/BLUE SHIELD

## 2019-08-28 ENCOUNTER — Other Ambulatory Visit: Payer: Self-pay

## 2019-08-28 ENCOUNTER — Telehealth: Payer: Self-pay | Admitting: Cardiovascular Disease

## 2019-08-28 MED ORDER — ROSUVASTATIN CALCIUM 10 MG PO TABS: 10.0000 mg | ORAL_TABLET | Freq: Every day | ORAL | 3 refills | Status: DC

## 2019-08-28 NOTE — Telephone Encounter (Signed)
°*  STAT* If patient is at the pharmacy, call can be transferred to refill team.   1. Which medications need to be refilled? (please list name of each medication and dose if known) rosuvastatin (CRESTOR) 10 MG 1 tablet daily   2. Which pharmacy/location (including street and city if local pharmacy) is medication to be sent to? [facility] in [place]   3. Do they need a 30 day or 90 day supply? 90 day

## 2019-08-28 NOTE — Telephone Encounter (Signed)
Refill sent.

## 2020-01-19 ENCOUNTER — Other Ambulatory Visit: Payer: Self-pay | Admitting: Internal Medicine

## 2020-01-19 DIAGNOSIS — E2839 Other primary ovarian failure: Secondary | ICD-10-CM

## 2020-02-03 ENCOUNTER — Other Ambulatory Visit: Payer: BC Managed Care – PPO

## 2020-02-10 ENCOUNTER — Inpatient Hospital Stay: Admission: RE | Admit: 2020-02-10 | Payer: BC Managed Care – PPO | Source: Ambulatory Visit

## 2020-02-16 ENCOUNTER — Ambulatory Visit
Admission: RE | Admit: 2020-02-16 | Discharge: 2020-02-16 | Disposition: A | Discharge status: Home / Self Care | Payer: Medicare Other | Source: Ambulatory Visit | Attending: Internal Medicine | Admitting: Internal Medicine

## 2020-02-16 ENCOUNTER — Other Ambulatory Visit: Payer: Self-pay

## 2020-02-16 DIAGNOSIS — E2839 Other primary ovarian failure: Secondary | ICD-10-CM | POA: Insufficient documentation

## 2020-07-27 ENCOUNTER — Other Ambulatory Visit: Payer: Self-pay

## 2020-07-27 ENCOUNTER — Ambulatory Visit
Admission: RE | Admit: 2020-07-27 | Discharge: 2020-07-27 | Disposition: A | Discharge status: Home / Self Care | Payer: Medicare Other | Source: Ambulatory Visit | Attending: Internal Medicine | Admitting: Internal Medicine

## 2020-07-27 ENCOUNTER — Other Ambulatory Visit: Payer: Self-pay | Admitting: Internal Medicine

## 2020-07-27 ENCOUNTER — Ambulatory Visit
Admission: RE | Admit: 2020-07-27 | Discharge: 2020-07-27 | Disposition: A | Discharge status: Home / Self Care | Payer: Medicare Other | Attending: Internal Medicine | Admitting: Internal Medicine

## 2020-07-27 DIAGNOSIS — M79644 Pain in right finger(s): Secondary | ICD-10-CM

## 2020-08-22 ENCOUNTER — Other Ambulatory Visit: Payer: Self-pay | Admitting: Internal Medicine

## 2020-08-22 DIAGNOSIS — Z1231 Encounter for screening mammogram for malignant neoplasm of breast: Secondary | ICD-10-CM

## 2020-08-24 ENCOUNTER — Other Ambulatory Visit: Payer: Self-pay

## 2020-08-24 ENCOUNTER — Ambulatory Visit
Admission: RE | Admit: 2020-08-24 | Discharge: 2020-08-24 | Disposition: A | Discharge status: Home / Self Care | Payer: Medicare Other | Source: Ambulatory Visit | Attending: Internal Medicine | Admitting: Internal Medicine

## 2020-08-24 DIAGNOSIS — Z1231 Encounter for screening mammogram for malignant neoplasm of breast: Secondary | ICD-10-CM | POA: Diagnosis present

## 2021-01-18 ENCOUNTER — Other Ambulatory Visit: Payer: Self-pay

## 2021-01-18 MED ORDER — ROSUVASTATIN CALCIUM 10 MG PO TABS: 10.0000 mg | ORAL_TABLET | Freq: Every day | ORAL | 3 refills | Status: DC

## 2021-02-28 ENCOUNTER — Other Ambulatory Visit: Payer: Self-pay

## 2021-02-28 ENCOUNTER — Encounter: Payer: Self-pay | Admitting: Nurse Practitioner

## 2021-02-28 ENCOUNTER — Ambulatory Visit (INDEPENDENT_AMBULATORY_CARE_PROVIDER_SITE_OTHER): Payer: Medicare Other | Admitting: Nurse Practitioner

## 2021-02-28 VITALS — BP 120/78 | HR 61 | Ht 67.0 in | Wt 162.0 lb

## 2021-02-28 DIAGNOSIS — E785 Hyperlipidemia, unspecified: Secondary | ICD-10-CM | POA: Diagnosis not present

## 2021-02-28 DIAGNOSIS — I10 Essential (primary) hypertension: Secondary | ICD-10-CM

## 2021-02-28 MED ORDER — EZETIMIBE 10 MG PO TABS: 10.0000 mg | ORAL_TABLET | Freq: Every day | ORAL | 1 refills | Status: DC

## 2021-02-28 MED ORDER — ROSUVASTATIN CALCIUM 10 MG PO TABS: 10.0000 mg | ORAL_TABLET | Freq: Every day | ORAL | 1 refills | Status: AC

## 2021-02-28 NOTE — Patient Instructions (Signed)
Medication Instructions:   Start Zetia 10 mg 1 Tablet by mouth daily.  Labwork:  NONE   Testing/Procedures:  NONE  Follow-Up:  As needed  Any Other Special Instructions Will Be Listed Below (If Applicable).     If you need a refill on your cardiac medications before your next appointment, please call your pharmacy.

## 2021-02-28 NOTE — Progress Notes (Signed)
Office Visit    Patient Name: Roberta Collins Date of Encounter: 02/28/2021  Primary Care Provider:  Willey Blade, MD Primary Cardiologist:  Kathlyn Sacramento, MD  Chief Complaint    Roberta Collins is a 65 y/o female with past history of hyperlipidemia, HTN, elevated coronary calcium score, coronary artery calcium seen on CT, nephrolithiasis, and vitamin D deficiency who presents for follow-up of her hyperlipidemia and concerns for rising LDL. She will be moving to [place] next week and would like to add additional therapy if indicated prior to the move.   Past Medical History    Past Medical History:  Diagnosis Date  . Cancer ([place])    skin ca  . Complication of anesthesia   . Coronary artery calcification seen on CAT scan    a. 07/10/2018 Cardiac CT: Cor Ca2+ = 446.  96th %'ile; b. 07/2018 ETT: Ex: 5:43, Max HR 151, HTN response (206/82). No ECG changes/ischemia.  Marland Kitchen History of kidney stones   . Hypercholesteremia   . Hypertension    Past Surgical History:  Procedure Laterality Date  . ANKLE SURGERY    . COLONOSCOPY WITH PROPOFOL N/A 08/08/2018   Procedure: COLONOSCOPY WITH PROPOFOL;  Surgeon: Jonathon Bellows, MD;  Location: [facility] ENDOSCOPY;  Service: Gastroenterology;  Laterality: N/A;  . ESOPHAGOGASTRODUODENOSCOPY (EGD) WITH PROPOFOL N/A 08/08/2018   Procedure: ESOPHAGOGASTRODUODENOSCOPY (EGD) WITH PROPOFOL;  Surgeon: Jonathon Bellows, MD;  Location[facility] END[facility]COPY;  Service: Gastroenterology;  Laterality: N/A;    Allergies  Allergies  Allergen Reactions  . Cephalexin Hives and Nausea Only    Swelling itching  . Penicillins Hives and Itching    Has patient had a PCN reaction causing immediate rash, facial/tongue/throat swelling, SOB or lightheadedness with hypotension: YES Has patient had a PCN reaction causing severe rash involving mucus membranes or skin necrosis: NO Has patient had a PCN reaction that required hospitalization NO Has patient had a PCN reaction occurring within  the last 10 years: NO If all of the above answers are "NO", then may proceed with Cephalosporin use.  . Cefdinir Hives  . Oxycodone-Acetaminophen Nausea Only    History of Present Illness    65 y/o female with past history of hyperlipidemia, HTN, elevated coronary calcium score, coronary artery calcium seen on CT, nephrolithiasis, vitamin D deficiency, who presents for follow-up of her hyperlipidemia and concerns for rising LDL.  Her cardiac history is significant for initial presentation for evaluation of hypertension and dizziness in 2019. She was having hypotension on telmisartan and was switched to amlodipine 2.5 mg. Unfortunately, she had leg swelling the amlodipine and she was switched to valsartan which she never started due to an issue with backorder at her pharmacy. She elected to restart telmisartan at 1/2 dose and has been tolerating the 20 mg since that time. She had a coronary calcium score in August 2019 for risk stratification due to family hx of hyperlipidemia (score 446). She started rosuvastatin 20 mg but subsequently had to reduce her dose to 10 mg due to myalgias. She had ETT Sept 2019 which was nl. She was seen in our office in Oct 2019 at which time she was doing well and was advised to follow-up on an as needed basis.   Today, she reports that she is feeling well without concerns of chest discomfort, dyspnea, syncope, orthopnea, or edema. She has mild, intermittent palpitations that do not interfere with her activity. She would like to discuss adjunct therapy for hyperlipidemia due to recent increase in LDL (  99 116  111) since 2019. She will be moving to [place] next week and would like to add additional therapy if indicated prior to the move.  Home Medications    Prior to Admission medications   Medication Sig Start Date End Date Taking? Authorizing Provider  aspirin (ASPIR-LOW) 81 MG EC tablet Aspir-Low 81 mg tablet,delayed release  Take 1 tablet every day by oral route.     [provider]  Coenzyme Q10 (CO Q-10) 100 MG CAPS Take 1 tablet by mouth daily.    [provider]  fexofenadine (ALLEGRA) 180 MG tablet Take by mouth.    [provider]  MAGNESIUM PO Take 1 tablet by mouth daily.    [provider]  Misc Natural Products (OSTEO BI-FLEX JOINT SHIELD) TABS Take 1 tablet by mouth daily.    [provider]  nitrofurantoin, macrocrystal-monohydrate, (MACROBID) 100 MG capsule Take 1 capsule (100 mg total) by mouth 2 (two) times daily. 02/27/19   Dutch Quint B, FNP  PAZEO 0.7 % SOLN  05/13/18   [provider]  predniSONE (STERAPRED UNI-PAK 21 TAB) 10 MG (21) TBPK tablet As  directed 02/27/19   Dutch Quint B, FNP  Probiotic Product (PROBIOTIC PO) Take 1 tablet by mouth daily.    [provider]  rosuvastatin (CRESTOR) 10 MG tablet Take 1 tablet (10 mg total) by mouth daily. 01/18/21 04/18/21  Theora Gianotti, NP  sulfamethoxazole-trimethoprim (BACTRIM DS,SEPTRA DS) 800-160 MG tablet Take 1 tablet by mouth 2 (two) times daily. 02/21/19   Brunetta Jeans, PA-C  telmisartan (MICARDIS) 40 MG tablet Take 0.5 tablets (20 mg total) by mouth daily. 08/08/18   Wellington Hampshire, MD    Review of Systems    + occasional palpitations that are self-terminating and not burdensome. Denies chest discomfort, dyspnea, edema, syncope, dizziness, orthopnea, PND, and early satiety.  All other systems reviewed and are otherwise negative except as noted above.  Physical Exam    VS:  BP 120/78 (BP Location: Left Arm, Patient Position: Sitting, Cuff Size: Normal)   Pulse 61   Ht 5\' 7"  (1.702 m)   Wt 162 lb (73.5 kg)   SpO2 97%   BMI 25.37 kg/m  , BMI Body mass index is 25.37 kg/m. GEN: Well nourished, well developed, in no acute distress. HEENT: normal. Neck: Supple, no JVD, carotid bruits, or masses. Cardiac: RRR, no murmurs, rubs, or gallops. No clubbing, cyanosis, edema.  Radials/PT 2+ and equal bilaterally.   Respiratory:  Respirations regular and unlabored, clear to auscultation bilaterally. GI: Soft, nontender, nondistended, BS + x 4. MS: no deformity or atrophy. Skin: warm and dry, no rash. Neuro:  Strength and sensation are intact. Psych: Normal affect.  Accessory Clinical Findings    ECG personally reviewed by me today - RSR @ 61 bpm, no ST/T Wave abnormalities - no acute changes.  Lab Results  Component Value Date   WBC 4.2 11/06/2016   HGB 12.2 11/06/2016   HCT 37.6 11/06/2016   MCV 85.8 11/06/2016   PLT 265 11/06/2016   Lab Results  Component Value Date   CREATININE 0.95 11/06/2016   BUN 13 11/06/2016   NA 138 11/06/2016   K 3.6 11/06/2016   CL 105 11/06/2016   CO2 25 11/06/2016   Lab Results  Component Value Date   ALT 17 08/18/2018   AST 19 08/18/2018   ALKPHOS 90 08/18/2018   BILITOT <0.2 08/18/2018   Lab Results  Component Value Date  CHOL 195 08/18/2018   HDL 86 08/18/2018   LDLCALC 99 08/18/2018   TRIG 48 08/18/2018   CHOLHDL 2.3 08/18/2018     Assessment & Plan    1.  Hyperlipidemia LDL goal < 100: Patient has a history of elevated calcium score of 446. She has lab work from PCP and is concerned about an increase in her recent LDL (99 116  111) since 2019. She has a hx of nl/high HDL and it remains > 39 on her most recent lipid panel. She is tolerating rosuvastatin 10 mg daily. She had severe myalgias with the higher dose of 20 mg. She would like to add additional therapy prior to moving to [facility] next week. We discussed the options for adjunct therapy and she prefers to add Zetia 10 mg daily.  Advised her to establish care with pcp in [facility] and we would recommend recheck of lipids and liver function in about 8 weeks. She is agreeable to plan. Will send Crestor and Zetia with refills so that she can have time to establish care in [facility].  2. Coronary calcium seen on CT/Elevated coronary calcium score: Coronary calcium score was 446 in 2019. Exercise  stress test performed after calcium scoring completed and was normal . She denies chest pain or dyspnea with exertion and denies recent change in activity tolerance. She is active at home and walks 45-60 min on a trail with hills several times per week.   3. Essential hypertension: Her BP is at goal today. She reports similar readings at home, with systolic BP consistently < 120 mmHg. Continue ARB at current dose.  Her PCP is refilling.   4. Disposition: Start Zetia 10 mg daily in addition to rosuvastatin 10 mg. Follow-up PRN -will obtain PCP in [place], [place] following move to continue to follow lipids going forward.  Murray Hodgkins, NP 02/28/2021, 8:36 AM

## 2021-03-02 ENCOUNTER — Telehealth: Payer: Self-pay | Admitting: Cardiovascular Disease

## 2021-03-02 ENCOUNTER — Other Ambulatory Visit: Payer: Self-pay

## 2021-03-02 MED ORDER — EZETIMIBE 10 MG PO TABS: 10.0000 mg | ORAL_TABLET | Freq: Every day | ORAL | 3 refills | Status: AC

## 2021-03-02 NOTE — Telephone Encounter (Signed)
Zetia 10 mg QD Sent in to [facility] 90-day supply, 30-day refills

## 2021-03-02 NOTE — Telephone Encounter (Signed)
zetia  [facility] [address]    *STAT* If patient is at the pharmacy, call can be transferred to refill team.   1. Which medications need to be refilled? (please list name of each medication and dose if known) zetia   2. Which pharmacy/location (including street and city if local pharmacy) is medication to be sent to? [place]   3. Do they need a 30 day or 90 day supply? 90  Resend to [facility] it is cheaper

## 2021-03-02 NOTE — Telephone Encounter (Signed)
Entered in Error

## 2021-11-29 ENCOUNTER — Other Ambulatory Visit: Payer: Self-pay | Admitting: Nurse Practitioner
